# Patient Record
Sex: Female | Born: 1980 | Race: Black or African American | Hispanic: No | Marital: Single | Smoking: Former smoker
Health system: Southern US, Community
[De-identification: ages and names within clinical notes are randomized; demographics above are authoritative.]

## PROBLEM LIST (undated history)

## (undated) DIAGNOSIS — M545 Low back pain, unspecified: Secondary | ICD-10-CM

## (undated) DIAGNOSIS — I10 Essential (primary) hypertension: Secondary | ICD-10-CM

## (undated) DIAGNOSIS — L02416 Cutaneous abscess of left lower limb: Secondary | ICD-10-CM

## (undated) DIAGNOSIS — T8859XA Other complications of anesthesia, initial encounter: Secondary | ICD-10-CM

## (undated) DIAGNOSIS — J45909 Unspecified asthma, uncomplicated: Secondary | ICD-10-CM

## (undated) DIAGNOSIS — K8020 Calculus of gallbladder without cholecystitis without obstruction: Secondary | ICD-10-CM

## (undated) DIAGNOSIS — K219 Gastro-esophageal reflux disease without esophagitis: Secondary | ICD-10-CM

## (undated) DIAGNOSIS — N611 Abscess of the breast and nipple: Secondary | ICD-10-CM

## (undated) DIAGNOSIS — G4700 Insomnia, unspecified: Secondary | ICD-10-CM

## (undated) DIAGNOSIS — T4145XA Adverse effect of unspecified anesthetic, initial encounter: Secondary | ICD-10-CM

## (undated) HISTORY — DX: Low back pain: M54.5

## (undated) HISTORY — DX: Insomnia, unspecified: G47.00

## (undated) HISTORY — DX: Cutaneous abscess of left lower limb: L02.416

## (undated) HISTORY — DX: Low back pain, unspecified: M54.50

## (undated) HISTORY — DX: Gastro-esophageal reflux disease without esophagitis: K21.9

## (undated) HISTORY — DX: Abscess of the breast and nipple: N61.1

## (undated) HISTORY — DX: Calculus of gallbladder without cholecystitis without obstruction: K80.20

---

## 2007-04-28 HISTORY — PX: OVARIAN CYST REMOVAL: SHX89

## 2009-10-31 ENCOUNTER — Ambulatory Visit: Payer: Self-pay | Admitting: Pain Medicine

## 2009-11-06 ENCOUNTER — Ambulatory Visit: Payer: Self-pay | Admitting: Pain Medicine

## 2009-11-10 ENCOUNTER — Ambulatory Visit: Payer: Self-pay | Admitting: Pain Medicine

## 2010-10-30 DIAGNOSIS — M545 Low back pain: Secondary | ICD-10-CM | POA: Insufficient documentation

## 2010-10-30 DIAGNOSIS — R52 Pain, unspecified: Secondary | ICD-10-CM | POA: Insufficient documentation

## 2010-10-30 DIAGNOSIS — IMO0002 Reserved for concepts with insufficient information to code with codable children: Secondary | ICD-10-CM | POA: Insufficient documentation

## 2011-01-16 DIAGNOSIS — G4489 Other headache syndrome: Secondary | ICD-10-CM | POA: Insufficient documentation

## 2011-01-18 DIAGNOSIS — S338XXA Sprain of other parts of lumbar spine and pelvis, initial encounter: Secondary | ICD-10-CM | POA: Insufficient documentation

## 2012-12-15 DIAGNOSIS — Z3042 Encounter for surveillance of injectable contraceptive: Secondary | ICD-10-CM | POA: Insufficient documentation

## 2014-12-21 DIAGNOSIS — L02416 Cutaneous abscess of left lower limb: Secondary | ICD-10-CM | POA: Insufficient documentation

## 2014-12-21 DIAGNOSIS — N611 Abscess of the breast and nipple: Secondary | ICD-10-CM | POA: Insufficient documentation

## 2015-08-06 DIAGNOSIS — F5104 Psychophysiologic insomnia: Secondary | ICD-10-CM | POA: Insufficient documentation

## 2015-08-06 DIAGNOSIS — K219 Gastro-esophageal reflux disease without esophagitis: Secondary | ICD-10-CM | POA: Insufficient documentation

## 2015-08-06 DIAGNOSIS — I10 Essential (primary) hypertension: Secondary | ICD-10-CM | POA: Insufficient documentation

## 2015-08-06 DIAGNOSIS — F1120 Opioid dependence, uncomplicated: Secondary | ICD-10-CM | POA: Insufficient documentation

## 2016-03-17 ENCOUNTER — Other Ambulatory Visit: Payer: Self-pay | Admitting: Family Medicine

## 2016-03-17 DIAGNOSIS — R319 Hematuria, unspecified: Secondary | ICD-10-CM

## 2016-03-31 ENCOUNTER — Ambulatory Visit
Admission: RE | Admit: 2016-03-31 | Discharge: 2016-03-31 | Disposition: A | Discharge status: Home / Self Care | Payer: Medicaid Other | Source: Ambulatory Visit | Attending: Family Medicine | Admitting: Family Medicine

## 2016-03-31 ENCOUNTER — Encounter: Payer: Self-pay | Admitting: Radiology

## 2016-03-31 DIAGNOSIS — R319 Hematuria, unspecified: Secondary | ICD-10-CM | POA: Diagnosis not present

## 2016-03-31 HISTORY — DX: Essential (primary) hypertension: I10

## 2016-03-31 HISTORY — DX: Unspecified asthma, uncomplicated: J45.909

## 2016-03-31 MED ORDER — IOPAMIDOL (ISOVUE-300) INJECTION 61%
150.0000 mL | Freq: Once | INTRAVENOUS | Status: AC | PRN
  Administered 2016-03-31: 150 mL via INTRAVENOUS

## 2016-05-12 ENCOUNTER — Other Ambulatory Visit: Payer: Self-pay | Admitting: Family Medicine

## 2016-05-12 DIAGNOSIS — R1011 Right upper quadrant pain: Secondary | ICD-10-CM

## 2016-05-13 ENCOUNTER — Ambulatory Visit
Admission: RE | Admit: 2016-05-13 | Discharge: 2016-05-13 | Disposition: A | Discharge status: Home / Self Care | Payer: Medicaid Other | Source: Ambulatory Visit | Attending: Family Medicine | Admitting: Family Medicine

## 2016-05-13 DIAGNOSIS — R1011 Right upper quadrant pain: Secondary | ICD-10-CM | POA: Diagnosis present

## 2016-05-13 DIAGNOSIS — K8020 Calculus of gallbladder without cholecystitis without obstruction: Secondary | ICD-10-CM | POA: Diagnosis not present

## 2016-05-22 ENCOUNTER — Other Ambulatory Visit: Payer: Self-pay

## 2016-05-26 ENCOUNTER — Ambulatory Visit (INDEPENDENT_AMBULATORY_CARE_PROVIDER_SITE_OTHER): Payer: Medicaid Other | Admitting: Surgery

## 2016-05-26 ENCOUNTER — Encounter: Payer: Self-pay | Admitting: Surgery

## 2016-05-26 VITALS — BP 159/99 | HR 76 | Temp 98.9°F | Ht 70.0 in | Wt 255.0 lb

## 2016-05-26 DIAGNOSIS — R1011 Right upper quadrant pain: Secondary | ICD-10-CM | POA: Diagnosis not present

## 2016-05-26 DIAGNOSIS — G8929 Other chronic pain: Secondary | ICD-10-CM

## 2016-05-26 NOTE — Patient Instructions (Addendum)
We will have you see Dr. Tobi BastosAnna (GI Physician ) next week for his opinion about Abdominal Pain. After you have seen him, we will see you back in the office to discuss surgery if the Gastroenterologist does not see any problems on exam. Please see both appointments below.  Low-Fat Diet for Pancreatitis or Gallbladder Conditions A low-fat diet can be helpful if you have pancreatitis or a gallbladder condition. With these conditions, your pancreas and gallbladder have trouble digesting fats. A healthy eating plan with less fat will help rest your pancreas and gallbladder and reduce your symptoms. What do I need to know about this diet?  Eat a low-fat diet.  Reduce your fat intake to less than 20-30% of your total daily calories. This is less than 50-60 g of fat per day.  Remember that you need some fat in your diet. Ask your dietician what your daily goal should be.  Choose nonfat and low-fat healthy foods. Look for the words "nonfat," "low fat," or "fat free."  As a guide, look on the label and choose foods with less than 3 g of fat per serving. Eat only one serving.  Avoid alcohol.  Do not smoke. If you need help quitting, talk with your health care provider.  Eat small frequent meals instead of three large heavy meals. What foods can I eat? Grains  Include healthy grains and starches such as potatoes, wheat bread, fiber-rich cereal, and brown rice. Choose whole grain options whenever possible. In adults, whole grains should account for 45-65% of your daily calories. Fruits and Vegetables  Eat plenty of fruits and vegetables. Fresh fruits and vegetables add fiber to your diet. Meats and Other Protein Sources  Eat lean meat such as chicken and pork. Trim any fat off of meat before cooking it. Eggs, fish, and beans are other sources of protein. In adults, these foods should account for 10-35% of your daily calories. Dairy  Choose low-fat milk and dairy options. Dairy includes fat and protein,  as well as calcium. Fats and Oils  Limit high-fat foods such as fried foods, sweets, baked goods, sugary drinks. Other  Creamy sauces and condiments, such as mayonnaise, can add extra fat. Think about whether or not you need to use them, or use smaller amounts or low fat options. What foods are not recommended?  High fat foods, such as:  [employer]aked goods.  Ice cream.  [place] toast.  Sweet rolls.  Pizza.  Cheese bread.  Foods covered with batter, butter, creamy sauces, or cheese.  Fried foods.  Sugary drinks and desserts.  Foods that cause gas or bloating This information is not intended to replace advice given to you by your health care provider. Make sure you discuss any questions you have with your health care provider. Document Released: 04/18/2013 Document Revised: 09/19/2015 Document Reviewed: 03/27/2013 Elsevier Interactive Patient Education  2017 [employer]
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
## 2016-06-03 ENCOUNTER — Ambulatory Visit: Payer: Self-pay | Admitting: Gastroenterology

## 2016-06-04 ENCOUNTER — Inpatient Hospital Stay: Admit: 2016-06-04 | Payer: Self-pay

## 2016-06-04 ENCOUNTER — Ambulatory Visit (INDEPENDENT_AMBULATORY_CARE_PROVIDER_SITE_OTHER): Payer: Medicaid Other | Admitting: Gastroenterology

## 2016-06-04 ENCOUNTER — Encounter: Payer: Self-pay | Admitting: Gastroenterology

## 2016-06-04 VITALS — BP 140/90 | HR 63 | Ht 70.0 in | Wt 258.0 lb

## 2016-06-04 DIAGNOSIS — R194 Change in bowel habit: Secondary | ICD-10-CM

## 2016-06-04 DIAGNOSIS — R1011 Right upper quadrant pain: Secondary | ICD-10-CM
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
## 2016-06-08 ENCOUNTER — Other Ambulatory Visit: Payer: Self-pay

## 2016-06-09 ENCOUNTER — Ambulatory Visit
Admission: RE | Admit: 2016-06-09 | Discharge: 2016-06-09 | Disposition: A | Discharge status: Home / Self Care | Payer: Medicaid Other | Source: Ambulatory Visit | Attending: Gastroenterology | Admitting: Gastroenterology

## 2016-06-09 ENCOUNTER — Ambulatory Visit: Payer: Medicaid Other | Admitting: Certified Registered Nurse Anesthetist

## 2016-06-09 ENCOUNTER — Encounter
Admission: RE | Disposition: A | Discharge status: Home / Self Care | Payer: Self-pay | Source: Ambulatory Visit | Attending: Gastroenterology

## 2016-06-09 ENCOUNTER — Other Ambulatory Visit
Admission: RE | Admit: 2016-06-09 | Discharge: 2016-06-09 | Disposition: A | Discharge status: Home / Self Care | Payer: Medicaid Other | Source: Ambulatory Visit | Attending: Gastroenterology | Admitting: Gastroenterology

## 2016-06-09 ENCOUNTER — Encounter: Payer: Self-pay | Admitting: Certified Registered Nurse Anesthetist

## 2016-06-09 DIAGNOSIS — I10 Essential (primary) hypertension: Secondary | ICD-10-CM | POA: Diagnosis not present

## 2016-06-09 DIAGNOSIS — Z79891 Long term (current) use of opiate analgesic: Secondary | ICD-10-CM | POA: Diagnosis not present

## 2016-06-09 DIAGNOSIS — R1013 Epigastric pain: Secondary | ICD-10-CM

## 2016-06-09 DIAGNOSIS — Z79899 Other long term (current) drug therapy: Secondary | ICD-10-CM | POA: Insufficient documentation

## 2016-06-09 DIAGNOSIS — R194 Change in bowel habit: Secondary | ICD-10-CM | POA: Insufficient documentation

## 2016-06-09 DIAGNOSIS — K2950 Unspecified chronic gastritis without bleeding: Secondary | ICD-10-CM | POA: Diagnosis not present

## 2016-06-09 DIAGNOSIS — K2970 Gastritis, unspecified, without bleeding: Secondary | ICD-10-CM

## 2016-06-09 DIAGNOSIS — R1011 Right upper quadrant pain: Secondary | ICD-10-CM | POA: Diagnosis not present

## 2016-06-09 DIAGNOSIS — F17210 Nicotine dependence, cigarettes, uncomplicated: Secondary | ICD-10-CM | POA: Insufficient documentation

## 2016-06-09 DIAGNOSIS — K219 Gastro-esophageal reflux disease without esophagitis: Secondary | ICD-10-CM | POA: Diagnosis not present

## 2016-06-09 DIAGNOSIS — J45909 Unspecified asthma, uncomplicated: Secondary | ICD-10-CM | POA: Diagnosis not present

## 2016-06-09 DIAGNOSIS — Z793 Long term (current) use of hormonal contraceptives: Secondary | ICD-10-CM | POA: Insufficient documentation

## 2016-06-09 HISTORY — PX: ESOPHAGOGASTRODUODENOSCOPY (EGD) WITH PROPOFOL: SHX5813

## 2016-06-09 HISTORY — PX: COLONOSCOPY WITH PROPOFOL: SHX5780

## 2016-06-09 LAB — POCT PREGNANCY, URINE: PREG TEST UR: NEGATIVE

## 2016-06-09 SURGERY — COLONOSCOPY WITH PROPOFOL
Anesthesia: General

## 2016-06-09 MED ORDER — MIDAZOLAM HCL 2 MG/2ML IJ SOLN
INTRAMUSCULAR | Status: AC
  Filled 2016-06-09: qty 2

## 2016-06-09 MED ORDER — PROPOFOL 500 MG/50ML IV EMUL
INTRAVENOUS | Status: DC | PRN
  Administered 2016-06-09: 140 ug/kg/min via INTRAVENOUS

## 2016-06-09 MED ORDER — SODIUM CHLORIDE 0.9 % IV SOLN
INTRAVENOUS | Status: DC
  Administered 2016-06-09: 11:00:00 via INTRAVENOUS

## 2016-06-09 MED ORDER — ACETAMINOPHEN 500 MG PO TABS
ORAL_TABLET | ORAL | Status: AC
  Administered 2016-06-09: 12:00:00
  Filled 2016-06-09: qty 2

## 2016-06-09 MED ORDER — LIDOCAINE HCL (PF) 2 % IJ SOLN
INTRAMUSCULAR | Status: AC
  Filled 2016-06-09: qty 2

## 2016-06-09 MED ORDER — PROPOFOL 500 MG/50ML IV EMUL
INTRAVENOUS | Status: AC
  Filled 2016-06-09: qty 50

## 2016-06-09 MED ORDER — PROPOFOL 10 MG/ML IV BOLUS
INTRAVENOUS | Status: AC
  Filled 2016-06-09: qty 20

## 2016-06-09 MED ORDER — PROPOFOL 10 MG/ML IV BOLUS
INTRAVENOUS | Status: DC | PRN
Start: 2016-06-09 — End: 2016-06-09
  Administered 2016-06-09: 40 mg via INTRAVENOUS
  Administered 2016-06-09: 20 mg via INTRAVENOUS

## 2016-06-09 MED ORDER — LIDOCAINE HCL (CARDIAC) 20 MG/ML IV SOLN
INTRAVENOUS | Status: DC | PRN
  Administered 2016-06-09: 50 mg via INTRAVENOUS

## 2016-06-09 MED ORDER — MIDAZOLAM HCL 2 MG/2ML IJ SOLN
INTRAMUSCULAR | Status: DC | PRN
  Administered 2016-06-09: 2 mg via INTRAVENOUS

## 2016-06-09 NOTE — Anesthesia Post-op Follow-up Note (Cosign Needed)
Anesthesia QCDR form completed.        
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
## 2016-06-09 NOTE — Anesthesia Postprocedure Evaluation (Signed)
Anesthesia Post Note  Patient: Candace LocksRhonda L Morrow  Procedure(s) Performed: Procedure(s) (LRB): COLONOSCOPY WITH PROPOFOL (N/A) ESOPHAGOGASTRODUODENOSCOPY (EGD) WITH PROPOFOL (N/A)  Patient location during evaluation: Endoscopy Anesthesia Type: General Level of consciousness: awake and alert and oriented Pain management: pain level controlled Vital Signs Assessment: post-procedure vital signs reviewed and stable Respiratory status: spontaneous breathing, nonlabored ventilation and respiratory function stable Cardiovascular status: blood pressure returned to baseline and stable Postop Assessment: no signs of nausea or vomiting Anesthetic complications: no     Last Vitals:  Vitals:   06/09/16 1215 06/09/16 1225  BP: 127/73 126/73  Pulse: 70 64  Resp: 20 14  Temp:      Last Pain:  Vitals:   06/09/16 1215  TempSrc:   PainSc: 8                  Taisley Mordan

## 2016-06-09 NOTE — Anesthesia Procedure Notes (Signed)
Date/Time: 06/09/2016 11:05 AM Performed by: Ginger CarneMICHELET, Hanan Moen Pre-anesthesia Checklist: Patient identified, Emergency Drugs available, Patient being monitored, Timeout performed and Suction available Patient Re-evaluated:Patient Re-evaluated prior to inductionOxygen Delivery Method: Nasal cannula

## 2016-06-09 NOTE — Op Note (Signed)
[facility] Gastroenterology Patient Name: Candace Morrow Procedure Date: 06/09/2016 10:52 AM MRN: [id number] Account #: [id number] Date of Birth: 1980-07-08 Admit Type: Outpatient Age: 36 Room: [facility] ENDO ROOM 1 Gender: Female Note Status: Finalized Procedure:            Colonoscopy Indications:          Change in bowel habits, Incidental change in bowel                        habits noted Providers:            Wyline Mood MD, MD Referring MD:         Courtney Paris. Cliffton Asters, MD (Referring MD) Medicines:            Monitored Anesthesia Care Complications:        No immediate complications. Procedure:            Pre-Anesthesia Assessment:                       - Prior to the procedure, a History and Physical was                        performed, and patient medications, allergies and                        sensitivities were reviewed. The patient's tolerance of                        previous anesthesia was reviewed.                       - The risks and benefits of the procedure and the                        sedation options and risks were discussed with the                        patient. All questions were answered and informed                        consent was obtained.                       - The risks and benefits of the procedure and the                        sedation options and risks were discussed with the                        patient. All questions were answered and informed                        consent was obtained.                       - ASA Grade Assessment: III - A patient with severe                        systemic disease.  After obtaining informed consent, the colonoscope was                        passed under direct vision. Throughout the procedure,                        the patient's blood pressure, pulse, and oxygen                        saturations were monitored continuously. The Olympus   CF-H180AL colonoscope ( S#: [id number] ) was introduced                        through the anus and advanced to the the cecum,                        identified by the appendiceal orifice, IC valve and                        transillumination. The colonoscopy was performed with                        ease. The patient tolerated the procedure well. The                        quality of the bowel preparation was good. Findings:      The perianal and digital rectal examinations were normal.      The entire examined colon appeared normal on direct and retroflexion       views.      The colon (entire examined portion) appeared normal. Biopsies for       histology were taken with a cold forceps for evaluation of microscopic       colitis. Impression:           - The entire examined colon is normal on direct and                        retroflexion views.                       - The entire examined colon is normal. Biopsied. Recommendation:       - Discharge patient to home (with escort).                       - Resume previous diet.                       - Continue present medications.                       - Await pathology results.                       - Repeat colonoscopy at age 36 for screening purposes.                       - Return to GI office as previously scheduled. Procedure Code(s):    --- Professional ---                       [phone], Colonoscopy, flexible; with biopsy, single or  multiple Diagnosis Code(s):    --- Professional ---                       R19.4, Change in bowel habit CPT copyright 2016 American Medical Association. All rights reserved. The codes documented in this report are preliminary and upon coder review may  be revised to meet current compliance requirements. Wyline Mood, MD Wyline Mood MD, MD 06/09/2016 11:39:28 AM This report has been signed electronically. Number of Addenda: 0 Note Initiated On: 06/09/2016 10:52 AM Scope Withdrawal Time:  0 hours 13 minutes 20 seconds  Total Procedure Duration: 0 hours 18 minutes 20 seconds       [facility]

## 2016-06-09 NOTE — Telephone Encounter (Signed)
error 

## 2016-06-09 NOTE — Op Note (Signed)
[facility] Gastroenterology Patient Name: Candace BaneRhonda Morrow Procedure Date: 06/09/2016 11:03 AM MRN: [id number] Account #: [id number] Date of Birth: [birth year] Admit Type: Outpatient Age: 36 Room: [facility] ENDO ROOM 1 Gender: Female Note Status: Finalized Procedure:            Upper GI endoscopy Indications:          Dyspepsia Providers:            Wyline MoodKiran Tilmon Wisehart MD, MD Referring MD:         Courtney ParisElizabeth B. Cliffton AstersWhite, MD (Referring MD) Medicines:            Monitored Anesthesia Care Complications:        No immediate complications. Procedure:            Pre-Anesthesia Assessment:                       - ASA Grade Assessment: III - A patient with severe                        systemic disease.                       - Prior to the procedure, a History and Physical was                        performed, and patient medications, allergies and                        sensitivities were reviewed. The patient's tolerance of                        previous anesthesia was reviewed.                       - The risks and benefits of the procedure and the                        sedation options and risks were discussed with the                        patient. All questions were answered and informed                        consent was obtained.                       - The risks and benefits of the procedure and the                        sedation options and risks were discussed with the                        patient. All questions were answered and informed                        consent was obtained.                       After obtaining informed consent, the endoscope was  passed under direct vision. Throughout the procedure,                        the patient's blood pressure, pulse, and oxygen                        saturations were monitored continuously. The                        Colonoscope was introduced through the mouth, and                        advanced to  the third part of duodenum. The upper GI                        endoscopy was somewhat difficult due to a lot of                        coughing {skip}. [Solution]. Findings:      The esophagus was normal.      The examined duodenum was normal.      Localized moderate inflammation characterized by congestion (edema) and       erythema was found in the gastric antrum. Biopsies were taken with a       cold forceps for histology. Impression:           - Normal esophagus.                       - Normal examined duodenum.                       - Gastritis. Biopsied. Recommendation:       - Discharge patient to home (with escort).                       - Resume previous diet.                       - Continue present medications.                       - Await pathology results.                       - Return to my office as previously scheduled. Procedure Code(s):    --- Professional ---                       [phone], Esophagogastroduodenoscopy, flexible, transoral;                        with biopsy, single or multiple Diagnosis Code(s):    --- Professional ---                       K29.70, Gastritis, unspecified, without bleeding                       R10.13, Epigastric pain CPT copyright 2016 American Medical Association. All rights reserved. The codes documented in this report are preliminary and upon coder review may  be revised to meet current compliance requirements. Wyline Mood, MD Wyline Mood MD, MD 06/09/2016 11:15:53 AM This report has  been signed electronically. Number of Addenda: 0 Note Initiated On: 06/09/2016 11:03 AM      [facility]

## 2016-06-09 NOTE — Anesthesia Preprocedure Evaluation (Addendum)
Anesthesia Evaluation  Patient identified by MRN, date of birth, ID band Patient awake    Reviewed: Allergy & Precautions, NPO status , Patient's Chart, lab work & pertinent test results, reviewed documented beta blocker date and time   Airway Mallampati: II  TM Distance: >3 FB     Dental no notable dental hx.    Pulmonary asthma , Current Smoker,    Pulmonary exam normal        Cardiovascular hypertension, Pt. on medications and Pt. on home beta blockers Normal cardiovascular exam     Neuro/Psych  Headaches,    GI/Hepatic Neg liver ROS, GERD  Medicated and Controlled,  Endo/Other  negative endocrine ROS  Renal/GU negative Renal ROS  negative genitourinary   Musculoskeletal negative musculoskeletal ROS (+)   Abdominal Normal abdominal exam  (+)   Peds negative pediatric ROS (+)  Hematology negative hematology ROS (+)   Anesthesia Other Findings   Reproductive/Obstetrics                             Anesthesia Physical Anesthesia Plan  ASA: II  Anesthesia Plan: General   Post-op Pain Management:    Induction: Intravenous  Airway Management Planned: Nasal Cannula  Additional Equipment:   Intra-op Plan:   Post-operative Plan: Extubation in OR  Informed Consent: I have reviewed the patients History and Physical, chart, labs and discussed the procedure including the risks, benefits and alternatives for the proposed anesthesia with the patient or authorized representative who has indicated his/her understanding and acceptance.   Dental advisory given  Plan Discussed with: CRNA and Surgeon  Anesthesia Plan Comments:         Anesthesia Quick Evaluation

## 2016-06-09 NOTE — Transfer of Care (Signed)
Immediate Anesthesia Transfer of Care Note  Patient: Kallie LocksRhonda L Ciani  Procedure(s) Performed: Procedure(s): COLONOSCOPY WITH PROPOFOL (N/A) ESOPHAGOGASTRODUODENOSCOPY (EGD) WITH PROPOFOL (N/A)  Patient Location: PACU  Anesthesia Type:General  Level of Consciousness: awake  Airway & Oxygen Therapy: Patient Spontanous Breathing  Post-op Assessment: Report given to RN and Post -op Vital signs reviewed and stable  Post vital signs: Reviewed and stable  Last Vitals:  Vitals:   06/09/16 1041  BP: 139/78  Pulse: 68  Resp: 18  Temp: (!) 35.9 C    Last Pain:  Vitals:   06/09/16 1041  TempSrc: Tympanic         Complications: No apparent anesthesia complications

## 2016-06-10 ENCOUNTER — Encounter: Payer: Self-pay | Admitting: Gastroenterology

## 2016-06-10 LAB — SURGICAL PATHOLOGY

## 2016-06-11 ENCOUNTER — Telehealth: Payer: Self-pay

## 2016-06-11 LAB — H. PYLORI ANTIGEN, STOOL: H. PYLORI STOOL AG, EIA: NEGATIVE

## 2016-06-11 NOTE — Telephone Encounter (Signed)
Patient called returning the  phone call. I read her the note below and she is not happy. She doesn't understand why she needs to come back to the office when she already received her results from Dr. Tobi BastosAnna. She just wants her surgery scheduled without having to come into the office. I explained to her that it is protocol for the patient to be seen prior to scheduling a surgery but that I will let the nurse know. Please call patient and advice.

## 2016-06-11 NOTE — Telephone Encounter (Signed)
Pt notified of h pylori lab test.

## 2016-06-11 NOTE — Telephone Encounter (Signed)
-----   Message from Wyline MoodKiran Anna, MD sent at 06/11/2016 11:23 AM EST ----- H pylori is negative

## 2016-06-11 NOTE — Telephone Encounter (Signed)
Returned phone call to patient at this time. No answer. Left voicemail for return phone call. 

## 2016-06-11 NOTE — Telephone Encounter (Signed)
Spoke with Dr. Everlene FarrierPabon about results of EGD/Colonoscopy, H.Pylori testing, and GI consultation. He would like to see patient back in office to discuss findings and potential for Cholecystectomy.  Appointment made for patient for 06/18/16.  Call made to patient at this time. No answer. Left voicemail for return phone call.

## 2016-06-12 ENCOUNTER — Telehealth: Payer: Self-pay

## 2016-06-12 NOTE — Telephone Encounter (Signed)
Patient called in at this time to speak with me in regards to this appointment. She is extremely angry that she is having to come back in to discuss the risks, benefits, and details of the surgery. She was very angry screaming at me on the phone. She yelled into the phone, "The next time I need surgery, I will take my butt down to [facility] because all of this has been ridiculous."  She was offered a referral to [facility] at this time and patient declined this. She was told if she changes her mind and would like a referral at any time she can let us know and this can be sent.  The patient wants a surgery date today. I explained to her that she will be given her surgery date and information at her appointment on 06/18/16. She was not happy with this as well.  She was read the appointment details that she has been given.

## 2016-06-12 NOTE — Telephone Encounter (Signed)
Call made once again to patient. No answer. Left voicemail for return phone call.  I will be glad to speak with patient if she returns phone call.

## 2016-06-16 ENCOUNTER — Telehealth: Payer: Self-pay

## 2016-06-16 ENCOUNTER — Other Ambulatory Visit: Payer: Self-pay

## 2016-06-16 NOTE — Telephone Encounter (Signed)
-----   Message from Rayann HemanGinger Feldpausch, [place] sent at 06/16/2016 11:21 AM EST -----   ----- Message ----- From: Wyline MoodKiran Anna, MD Sent: 06/16/2016  10:48 AM To: Rayann HemanGinger Feldpausch, CMA  Biopsies show only gastritis. Recall colonoscopy at age 36. F/u with Dr Everlene FarrierPabon

## 2016-06-16 NOTE — Telephone Encounter (Signed)
Encounter opened in error

## 2016-06-16 NOTE — Telephone Encounter (Signed)
Spoke with patient and advised of gastritis and follow-up with Dr. Everlene FarrierPabon. Colonoscopy due after age 36.

## 2016-06-18 ENCOUNTER — Encounter: Payer: Self-pay | Admitting: Surgery

## 2016-06-18 ENCOUNTER — Ambulatory Visit (INDEPENDENT_AMBULATORY_CARE_PROVIDER_SITE_OTHER): Payer: Medicaid Other | Admitting: Surgery

## 2016-06-18 VITALS — BP 151/90 | HR 79 | Temp 98.5°F | Ht 65.0 in | Wt 259.0 lb

## 2016-06-18 DIAGNOSIS — K8020 Calculus of gallbladder without cholecystitis without obstruction: Secondary | ICD-10-CM
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
## 2016-06-18 NOTE — Patient Instructions (Signed)
You have requested to have your gallbladder removed. We will arrange for this to be done on 06/29/16 at [facility] with Dr. Everlene FarrierPabon.  You will most likely be out of work 1-2 weeks for this surgery. You will return after your post-op appointment with a lifting restriction for approximately 4 more weeks.  You will be able to eat anything you would like to following surgery. But, start by eating a bland diet and advance this as tolerated.  Please see the (blue)pre-care form that you have been given today. If you have any questions, please call our office.  Laparoscopic Cholecystectomy Laparoscopic cholecystectomy is surgery to remove the gallbladder. The gallbladder is located in the upper right part of the abdomen, behind the liver. It is a storage sac for bile, which is produced in the liver. Bile aids in the digestion and absorption of fats. Cholecystectomy is often done for inflammation of the gallbladder (cholecystitis). This condition is usually caused by a buildup of gallstones (cholelithiasis) in the gallbladder. Gallstones can block the flow of bile, and that can result in inflammation and pain. In severe cases, emergency surgery may be required. If emergency surgery is not required, you will have time to prepare for the procedure. Laparoscopic surgery is an alternative to open surgery. Laparoscopic surgery has a shorter recovery time. Your common bile duct may also need to be examined during the procedure. If stones are found in the common bile duct, they may be removed. LET [facility] KNOW ABOUT:  Any allergies you have.  All medicines you are taking, including vitamins, herbs, eye drops, creams, and over-the-counter medicines.  Previous problems you or members of your family have had with the use of anesthetics.  Any blood disorders you have.  Previous surgeries you have had.    Any medical conditions you have. RISKS AND COMPLICATIONS Generally, this is a safe  procedure. However, problems may occur, including:  Infection.  Bleeding.  Allergic reactions to medicines.  Damage to other structures or organs.  A stone remaining in the common bile duct.  A bile leak from the cyst duct that is clipped when your gallbladder is removed.  The need to convert to open surgery, which requires a larger incision in the abdomen. This may be necessary if your surgeon thinks that it is not safe to continue with a laparoscopic procedure. BEFORE THE PROCEDURE  Ask your health care provider about:  Changing or stopping your regular medicines. This is especially important if you are taking diabetes medicines or blood thinners.  Taking medicines such as aspirin and ibuprofen. These medicines can thin your blood. Do not take these medicines before your procedure if your health care provider instructs you not to.  Follow instructions from your health care provider about eating or drinking restrictions.  Let your health care provider know if you develop a cold or an infection before surgery.  Plan to have someone take you home after the procedure.  Ask your health care provider how your surgical site will be marked or identified.  You may be given antibiotic medicine to help prevent infection. PROCEDURE  To reduce your risk of infection:  Your health care team will wash or sanitize their hands.  Your skin will be washed with soap.  An IV tube may be inserted into one of your veins.  You will be given a medicine to make you fall asleep (general anesthetic).  A breathing tube will be placed in your mouth.  The surgeon  will make several small cuts (incisions) in your abdomen.  A thin, lighted tube (laparoscope) that has a tiny camera on the end will be inserted through one of the small incisions. The camera on the laparoscope will send a picture to a TV screen (monitor) in the operating room. This will give the surgeon a good view inside your  abdomen.  A gas will be pumped into your abdomen. This will expand your abdomen to give the surgeon more room to perform the surgery.  Other tools that are needed for the procedure will be inserted through the other incisions. The gallbladder will be removed through one of the incisions.  After your gallbladder has been removed, the incisions will be closed with stitches (sutures), staples, or skin glue.  Your incisions may be covered with a bandage (dressing). The procedure may vary among health care providers and hospitals. AFTER THE PROCEDURE  Your blood pressure, heart rate, breathing rate, and blood oxygen level will be monitored often until the medicines you were given have worn off.  You will be given medicines as needed to control your pain.   This information is not intended to replace advice given to you by your health care provider. Make sure you discuss any questions you have with your health care provider.   Document Released: 04/13/2005 Document Revised: 01/02/2015 Document Reviewed: 11/23/2012 Elsevier Interactive Patient Education 2016 [place] Diet for Pancreatitis or Gallbladder Conditions A low-fat diet can be helpful if you have pancreatitis or a gallbladder condition. With these conditions, your pancreas and gallbladder have trouble digesting fats. A healthy eating plan with less fat will help rest your pancreas and gallbladder and reduce your symptoms. WHAT DO I NEED TO KNOW ABOUT THIS DIET?  Eat a low-fat diet.  Reduce your fat intake to less than 20-30% of your total daily calories. This is less than 50-60 g of fat per day.  Remember that you need some fat in your diet. Ask your dietician what your daily goal should be.  Choose nonfat and low-fat healthy foods. Look for the words "nonfat," "low fat," or "fat free."  As a guide, look on the label and choose foods with less than 3 g of fat per serving. Eat only one serving.  Avoid  alcohol.  Do not smoke. If you need help quitting, talk with your health care provider.  Eat small frequent meals instead of three large heavy meals. WHAT FOODS CAN I EAT? Grains Include healthy grains and starches such as potatoes, wheat bread, fiber-rich cereal, and brown rice. Choose whole grain options whenever possible. In adults, whole grains should account for 45-65% of your daily calories.  Fruits and Vegetables Eat plenty of fruits and vegetables. Fresh fruits and vegetables add fiber to your diet. Meats and Other Protein Sources Eat lean meat such as chicken and pork. Trim any fat off of meat before cooking it. Eggs, fish, and beans are other sources of protein. In adults, these foods should account for 10-35% of your daily calories. Dairy Choose low-fat milk and dairy options. Dairy includes fat and protein, as well as calcium.  Fats and Oils Limit high-fat foods such as fried foods, sweets, baked goods, sugary drinks.  Other Creamy sauces and condiments, such as mayonnaise, can add extra fat. Think about whether or not you need to use them, or use smaller amounts or low fat options. WHAT FOODS ARE NOT RECOMMENDED?  High fat foods, such as:  [employer].  Ice  cream.  French toast.  Sweet rolls.  Pizza.  Cheese bread.  Foods covered with batter, butter, creamy sauces, or cheese.  Fried foods.  Sugary drinks and desserts.  Foods that cause gas or bloating   This information is not intended to replace advice given to you by your health care provider. Make sure you discuss any questions you have with your health care provider.   Document Released: 04/18/2013 Document Reviewed: 04/18/2013 Elsevier Interactive Patient Education [employer].

## 2016-06-19 ENCOUNTER — Telehealth: Payer: Self-pay

## 2016-06-19 NOTE — Telephone Encounter (Signed)
No authorization required for CPT code [id number]. Patient has Medicaid.

## 2016-06-19 NOTE — Telephone Encounter (Signed)
Patient requested work note for future surgical plans to be faxed to her employer ([employer]- [place]). Call made to the place of work at this time. They state that their is not a fax number at that location and that the patient will need to bring the work note in.

## 2016-06-19 NOTE — Telephone Encounter (Signed)
Called patient to advise of Surgery Date as well as Pre-Admission appointment date, time, and location. No answer. Left voicemail for return phone call.  Surgery information is below:  Surgery Date: 06/29/16  Pre-admit Appointment: 06/22/16 from 1p-5p (Phone)  Patient has been advised to call [phone] the day before surgery between 1-3pm to obtain arrival time.

## 2016-06-19 NOTE — Telephone Encounter (Signed)
Returned phone call to patient at this time. I reviewed her Surgery and Pre-admission information. I also explained that I have contacted her employer and their is no fax available. She ask that I send her work note via [employer] visit[employer]. A note has been printed and was sent out in the mail to her manager per patient's request.

## 2016-06-22 ENCOUNTER — Encounter
Admission: RE | Admit: 2016-06-22 | Discharge: 2016-06-22 | Disposition: A | Discharge status: Home / Self Care | Payer: Medicaid Other | Source: Ambulatory Visit | Attending: Surgery | Admitting: Surgery

## 2016-06-22 ENCOUNTER — Encounter: Payer: Self-pay | Admitting: *Deleted

## 2016-06-22 NOTE — Patient Instructions (Signed)
  Your procedure is scheduled on: 06-29-16 Report to Same Day Surgery 2nd floor medical mall [facility](Medical Mall Entrance-take elevator on left to 2nd floor.  Check in with surgery information desk.) To find out your arrival time please call [phone] between 1PM - 3PM on 06-26-16  Remember: Instructions that are not followed completely may result in serious medical risk, up to and including death, or upon the discretion of your surgeon and anesthesiologist your surgery may need to be rescheduled.    _x___ 1. Do not eat food or drink liquids after midnight. No gum chewing or hard candies.     __x__ 2. No Alcohol for 24 hours before or after surgery.   __x__3. No Smoking for 24 prior to surgery.   ____  4. Bring all medications with you on the day of surgery if instructed.    __x__ 5. Notify your doctor if there is any change in your medical condition     (cold, fever, infections).     Do not wear jewelry, make-up, hairpins, clips or nail polish.  Do not wear lotions, powders, or perfumes. You may wear deodorant.  Do not shave 48 hours prior to surgery. Men may shave face and neck.  Do not bring valuables to the hospital.    [facility] not responsible for any belongings or valuables.               Contacts, dentures or bridgework may not be worn into surgery.  Leave your suitcase in the car. After surgery it may be brought to your room.  For patients admitted to the hospital, discharge time is determined by your treatment team.   Patients discharged the day of surgery will not be allowed to drive home.  You will need someone to drive you home and stay with you the night of your procedure.    Please read over the following fact sheets that you were given:   [facility] and or MRSA Information   _x___ Take these medicines the morning of surgery with A SIP OF WATER:    1. PROPRANOLOL  2. LOSARTAN  3. SUBUTEX  4. PRILOSEC  5. TAKE AN EXTRA PRILOSEC ON Sunday  NIGHT BEFORE BED  6.  ____Fleets enema or Magnesium Citrate as directed.   ____ Use CHG Soap or sage wipes as directed on instruction sheet   __X__ Use inhalers on the day of surgery and bring to hospital day of surgery-USE ALBUTEROL INHALER AT HOME AND BRING TO HOSPITAL DAY OF SURGERY  ____ Stop metformin 2 days prior to surgery    ____ Take 1/2 of usual insulin dose the night before surgery and none on the morning of           surgery.   ____ Stop Aspirin, Coumadin, Pllavix ,Eliquis, Effient, or Pradaxa  x__ Stop Anti-inflammatories such as Advil, Aleve, Ibuprofen, Motrin, Naproxen,          Naprosyn, Goodies powders or aspirin products NOW-Ok to take Tylenol.   ____ Stop supplements until after surgery.    ____ Bring C-Pap to the hospital.

## 2016-06-22 NOTE — Pre-Procedure Instructions (Signed)
[facility]14/[facility] Component Name Value Ref Range  Vent Rate (bpm) 84   PR Interval (msec) 158   QRS Interval (msec) 72   QT Interval (msec) 372   QTc (msec) 439   Result Narrative  Normal sinus rhythm Normal ECG When compared with ECG of 22-Apr-2016 20:26, No significant change was found I reviewed and concur with this report. Electronically signed WU:[email or web address]:DONAHUE, MD, Marcial PacasIMOTHY [phone]([id number]) on 05/11/2016 7:49:51 AM  Status Results Details    [facility]")' href="[email or web address]/">Encounter Summary

## 2016-06-23 ENCOUNTER — Other Ambulatory Visit: Payer: Medicaid Other

## 2016-06-28 MED ORDER — CEFAZOLIN SODIUM-DEXTROSE 2-4 GM/100ML-% IV SOLN
2.0000 g | INTRAVENOUS | Status: AC
  Administered 2016-06-29: 2 g via INTRAVENOUS

## 2016-06-29 ENCOUNTER — Ambulatory Visit
Admission: RE | Admit: 2016-06-29 | Discharge: 2016-06-29 | Disposition: A | Discharge status: Home / Self Care | Payer: Medicaid Other | Source: Ambulatory Visit | Attending: Surgery | Admitting: Surgery

## 2016-06-29 ENCOUNTER — Ambulatory Visit: Payer: Medicaid Other | Admitting: Anesthesiology

## 2016-06-29 ENCOUNTER — Encounter: Payer: Self-pay | Admitting: *Deleted

## 2016-06-29 ENCOUNTER — Encounter
Admission: RE | Disposition: A | Discharge status: Home / Self Care | Payer: Self-pay | Source: Ambulatory Visit | Attending: Surgery

## 2016-06-29 DIAGNOSIS — J45909 Unspecified asthma, uncomplicated: Secondary | ICD-10-CM | POA: Insufficient documentation

## 2016-06-29 DIAGNOSIS — K8020 Calculus of gallbladder without cholecystitis without obstruction: Secondary | ICD-10-CM | POA: Diagnosis not present

## 2016-06-29 DIAGNOSIS — K219 Gastro-esophageal reflux disease without esophagitis: Secondary | ICD-10-CM | POA: Insufficient documentation

## 2016-06-29 DIAGNOSIS — F17210 Nicotine dependence, cigarettes, uncomplicated: Secondary | ICD-10-CM | POA: Diagnosis not present

## 2016-06-29 DIAGNOSIS — Z79899 Other long term (current) drug therapy: Secondary | ICD-10-CM | POA: Insufficient documentation

## 2016-06-29 DIAGNOSIS — K8080 Other cholelithiasis without obstruction: Secondary | ICD-10-CM | POA: Diagnosis present

## 2016-06-29 DIAGNOSIS — I10 Essential (primary) hypertension: Secondary | ICD-10-CM | POA: Diagnosis not present

## 2016-06-29 DIAGNOSIS — K8010 Calculus of gallbladder with chronic cholecystitis without obstruction: Secondary | ICD-10-CM | POA: Insufficient documentation

## 2016-06-29 HISTORY — DX: Other complications of anesthesia, initial encounter: T88.59XA

## 2016-06-29 HISTORY — PX: CHOLECYSTECTOMY: SHX55

## 2016-06-29 HISTORY — DX: Adverse effect of unspecified anesthetic, initial encounter: T41.45XA

## 2016-06-29 LAB — POCT PREGNANCY, URINE: PREG TEST UR: NEGATIVE

## 2016-06-29 SURGERY — LAPAROSCOPIC CHOLECYSTECTOMY
Anesthesia: General | Wound class: Clean Contaminated

## 2016-06-29 MED ORDER — LIDOCAINE HCL (CARDIAC) 20 MG/ML IV SOLN
INTRAVENOUS | Status: DC | PRN
  Administered 2016-06-29: 40 mg via INTRAVENOUS

## 2016-06-29 MED ORDER — FENTANYL CITRATE (PF) 100 MCG/2ML IJ SOLN
25.0000 ug | INTRAMUSCULAR | Status: DC | PRN
  Administered 2016-06-29 (×3): 25 ug via INTRAVENOUS

## 2016-06-29 MED ORDER — DEXMEDETOMIDINE HCL IN NACL 200 MCG/50ML IV SOLN
INTRAVENOUS | Status: AC
  Filled 2016-06-29: qty 50

## 2016-06-29 MED ORDER — MORPHINE SULFATE (PF) 4 MG/ML IV SOLN
1.0000 mg | INTRAVENOUS | Status: DC | PRN
  Administered 2016-06-29 (×2): 2 mg via INTRAVENOUS

## 2016-06-29 MED ORDER — DEXMEDETOMIDINE HCL IN NACL 200 MCG/50ML IV SOLN
INTRAVENOUS | Status: DC | PRN
  Administered 2016-06-29: 12 ug via INTRAVENOUS

## 2016-06-29 MED ORDER — MORPHINE SULFATE (PF) 4 MG/ML IV SOLN
INTRAVENOUS | Status: AC
  Filled 2016-06-29: qty 1

## 2016-06-29 MED ORDER — FENTANYL CITRATE (PF) 100 MCG/2ML IJ SOLN
INTRAMUSCULAR | Status: AC
  Filled 2016-06-29: qty 2

## 2016-06-29 MED ORDER — PROPOFOL 10 MG/ML IV BOLUS
INTRAVENOUS | Status: DC | PRN
  Administered 2016-06-29: 140 mg via INTRAVENOUS
  Administered 2016-06-29: 60 mg via INTRAVENOUS

## 2016-06-29 MED ORDER — SUGAMMADEX SODIUM 500 MG/5ML IV SOLN
INTRAVENOUS | Status: DC | PRN
  Administered 2016-06-29: 235 mg via INTRAVENOUS

## 2016-06-29 MED ORDER — CHLORHEXIDINE GLUCONATE CLOTH 2 % EX PADS: 6.0000 | MEDICATED_PAD | Freq: Once | CUTANEOUS | Status: DC

## 2016-06-29 MED ORDER — KETOROLAC TROMETHAMINE 30 MG/ML IJ SOLN
INTRAMUSCULAR | Status: DC | PRN
  Administered 2016-06-29: 30 mg via INTRAVENOUS

## 2016-06-29 MED ORDER — KETOROLAC TROMETHAMINE 30 MG/ML IJ SOLN
INTRAMUSCULAR | Status: AC
  Filled 2016-06-29: qty 1

## 2016-06-29 MED ORDER — MIDAZOLAM HCL 2 MG/2ML IJ SOLN
INTRAMUSCULAR | Status: AC
  Filled 2016-06-29: qty 2

## 2016-06-29 MED ORDER — MIDAZOLAM HCL 2 MG/2ML IJ SOLN
INTRAMUSCULAR | Status: DC | PRN
  Administered 2016-06-29: 2 mg via INTRAVENOUS

## 2016-06-29 MED ORDER — ROCURONIUM BROMIDE 100 MG/10ML IV SOLN
INTRAVENOUS | Status: DC | PRN
  Administered 2016-06-29: 10 mg via INTRAVENOUS
  Administered 2016-06-29: 40 mg via INTRAVENOUS

## 2016-06-29 MED ORDER — SUGAMMADEX SODIUM 500 MG/5ML IV SOLN
INTRAVENOUS | Status: AC
  Filled 2016-06-29: qty 5

## 2016-06-29 MED ORDER — BUPIVACAINE-EPINEPHRINE 0.25% -1:200000 IJ SOLN
INTRAMUSCULAR | Status: DC | PRN
  Administered 2016-06-29: 30 mL

## 2016-06-29 MED ORDER — OXYCODONE-ACETAMINOPHEN 10-325 MG PO TABS: 1.0000 | ORAL_TABLET | ORAL | 0 refills | Status: DC | PRN

## 2016-06-29 MED ORDER — CEFAZOLIN SODIUM-DEXTROSE 2-4 GM/100ML-% IV SOLN
INTRAVENOUS | Status: AC
  Filled 2016-06-29: qty 100

## 2016-06-29 MED ORDER — OXYCODONE-ACETAMINOPHEN 5-325 MG PO TABS
2.0000 | ORAL_TABLET | ORAL | Status: DC | PRN
Start: 2016-06-29 — End: 2016-06-29
  Administered 2016-06-29: 2 via ORAL

## 2016-06-29 MED ORDER — OXYCODONE-ACETAMINOPHEN 5-325 MG PO TABS
ORAL_TABLET | ORAL | Status: AC
  Administered 2016-06-29: 2 via ORAL
  Filled 2016-06-29: qty 2

## 2016-06-29 MED ORDER — LACTATED RINGERS IV SOLN
INTRAVENOUS | Status: DC
  Administered 2016-06-29 (×2): via INTRAVENOUS

## 2016-06-29 MED ORDER — PROPOFOL 10 MG/ML IV BOLUS
INTRAVENOUS | Status: AC
  Filled 2016-06-29: qty 20

## 2016-06-29 MED ORDER — FENTANYL CITRATE (PF) 100 MCG/2ML IJ SOLN: 25.0000 ug | INTRAMUSCULAR | Status: DC | PRN

## 2016-06-29 MED ORDER — FENTANYL CITRATE (PF) 100 MCG/2ML IJ SOLN
INTRAMUSCULAR | Status: DC | PRN
  Administered 2016-06-29: 50 ug via INTRAVENOUS
  Administered 2016-06-29: 75 ug via INTRAVENOUS
  Administered 2016-06-29: 25 ug via INTRAVENOUS
  Administered 2016-06-29: 50 ug via INTRAVENOUS

## 2016-06-29 MED ORDER — BUPIVACAINE-EPINEPHRINE (PF) 0.25% -1:200000 IJ SOLN
INTRAMUSCULAR | Status: AC
  Filled 2016-06-29: qty 30

## 2016-06-29 SURGICAL SUPPLY — 46 items
APPLICATOR COTTON TIP 6IN STRL (MISCELLANEOUS) ×3 IMPLANT
APPLIER CLIP 5 13 M/L LIGAMAX5 (MISCELLANEOUS) ×3
BLADE SURG 15 STRL LF DISP TIS (BLADE) ×1 IMPLANT
BLADE SURG 15 STRL SS (BLADE) ×2
CANISTER SUCT 1200ML W/VALVE (MISCELLANEOUS) ×3 IMPLANT
CHLORAPREP W/TINT 26ML (MISCELLANEOUS) ×3 IMPLANT
CHOLANGIOGRAM CATH TAUT (CATHETERS) IMPLANT
CLEANER CAUTERY TIP 5X5 PAD (MISCELLANEOUS) ×1 IMPLANT
CLIP APPLIE 5 13 M/L LIGAMAX5 (MISCELLANEOUS) ×1 IMPLANT
DECANTER SPIKE VIAL GLASS SM (MISCELLANEOUS) IMPLANT
DEVICE TROCAR PUNCTURE CLOSURE (ENDOMECHANICALS) IMPLANT
DRAPE C-ARM XRAY 36X54 (DRAPES) IMPLANT
ELECT CAUTERY BLADE 6.4 (BLADE) ×3 IMPLANT
ELECT REM PT RETURN 9FT ADLT (ELECTROSURGICAL) ×3
ELECTRODE REM PT RTRN 9FT ADLT (ELECTROSURGICAL) ×1 IMPLANT
ENDOPOUCH RETRIEVER 10 (MISCELLANEOUS) ×3 IMPLANT
GLOVE BIO SURGEON STRL SZ7 (GLOVE) ×3 IMPLANT
GOWN STRL REUS W/ TWL LRG LVL3 (GOWN DISPOSABLE) ×3 IMPLANT
GOWN STRL REUS W/TWL LRG LVL3 (GOWN DISPOSABLE) ×6
IRRIGATION STRYKERFLOW (MISCELLANEOUS) ×1 IMPLANT
IRRIGATOR STRYKERFLOW (MISCELLANEOUS) ×3
IV CATH ANGIO 12GX3 LT BLUE (NEEDLE) ×3 IMPLANT
IV NS 1000ML (IV SOLUTION) ×2
IV NS 1000ML BAXH (IV SOLUTION) ×1 IMPLANT
L-HOOK LAP DISP 36CM (ELECTROSURGICAL) ×3
LHOOK LAP DISP 36CM (ELECTROSURGICAL) ×1 IMPLANT
LIQUID BAND (GAUZE/BANDAGES/DRESSINGS) ×3 IMPLANT
NEEDLE HYPO 22GX1.5 SAFETY (NEEDLE) ×3 IMPLANT
PACK LAP CHOLECYSTECTOMY (MISCELLANEOUS) ×3 IMPLANT
PAD CLEANER CAUTERY TIP 5X5 (MISCELLANEOUS) ×2
PENCIL ELECTRO HAND CTR (MISCELLANEOUS) ×3 IMPLANT
SCISSORS METZENBAUM CVD 33 (INSTRUMENTS) ×3 IMPLANT
SLEEVE ENDOPATH XCEL 5M (ENDOMECHANICALS) ×6 IMPLANT
SOL ANTI-FOG 6CC FOG-OUT (MISCELLANEOUS) ×1 IMPLANT
SOL FOG-OUT ANTI-FOG 6CC (MISCELLANEOUS) ×2
SPONGE LAP 18X18 5 PK (GAUZE/BANDAGES/DRESSINGS) IMPLANT
STOPCOCK 3 WAY  REPLAC (MISCELLANEOUS) IMPLANT
SUT ETHIBOND 0 MO6 C/R (SUTURE) IMPLANT
SUT MNCRL AB 4-0 PS2 18 (SUTURE) ×3 IMPLANT
SUT VIC AB 0 CT2 27 (SUTURE) IMPLANT
SUT VICRYL 0 AB UR-6 (SUTURE) ×6 IMPLANT
SYR 20CC LL (SYRINGE) ×3 IMPLANT
TROCAR XCEL BLUNT TIP 100MML (ENDOMECHANICALS) ×3 IMPLANT
TROCAR XCEL NON-BLD 5MMX100MML (ENDOMECHANICALS) ×3 IMPLANT
TUBING INSUFFLATOR HI FLOW (MISCELLANEOUS) ×3 IMPLANT
WATER STERILE IRR 1000ML POUR (IV SOLUTION) ×3 IMPLANT

## 2016-06-29 NOTE — Interval H&P Note (Signed)
History and Physical Interval Note:  06/29/2016 11:22 AM  Candace Morrow  has presented today for surgery, with the diagnosis of gallstones  The various methods of treatment have been discussed with the patient and family. After consideration of risks, benefits and other options for treatment, the patient has consented to  Procedure(s): LAPAROSCOPIC CHOLECYSTECTOMY (N/A) as a surgical intervention .  The patient's history has been reviewed, patient examined, no change in status, stable for surgery.  I have reviewed the patient's chart and labs.  Questions were answered to the patient's satisfaction.     Diego F Pabon

## 2016-06-29 NOTE — Discharge Instructions (Signed)

## 2016-06-29 NOTE — Anesthesia Post-op Follow-up Note (Cosign Needed)
Anesthesia QCDR form completed.        

## 2016-06-29 NOTE — Transfer of Care (Signed)
Immediate Anesthesia Transfer of Care Note  Patient: Candace Morrow  Procedure(s) Performed: Procedure(s): LAPAROSCOPIC CHOLECYSTECTOMY (N/A)  Patient Location: PACU  Anesthesia Type:General  Level of Consciousness: sedated  Airway & Oxygen Therapy: Patient Spontanous Breathing and Patient connected to face mask oxygen  Post-op Assessment: Report given to RN and Post -op Vital signs reviewed and stable  Post vital signs: Reviewed and stable  Last Vitals:  Vitals:   06/29/16 1014 06/29/16 1312  BP: (!) 156/97 (!) 152/68  Pulse: 78 78  Resp: 16 (!) 27  Temp: 37.5 C 36.3 C    Last Pain:  Vitals:   06/29/16 1312  TempSrc:   PainSc: Asleep         Complications: No apparent anesthesia complications

## 2016-06-29 NOTE — Anesthesia Procedure Notes (Signed)
Performed by: Jayleene Glaeser       

## 2016-06-29 NOTE — Anesthesia Postprocedure Evaluation (Signed)
Anesthesia Post Note  Patient: Candace Morrow  Procedure(s) Performed: Procedure(s) (LRB): LAPAROSCOPIC CHOLECYSTECTOMY (N/A)  Patient location during evaluation: PACU Anesthesia Type: General Level of consciousness: awake and alert and oriented Pain management: pain level controlled Vital Signs Assessment: post-procedure vital signs reviewed and stable Respiratory status: spontaneous breathing Cardiovascular status: blood pressure returned to baseline Anesthetic complications: no     Last Vitals:  Vitals:   06/29/16 1330 06/29/16 1337  BP: (!) 150/89   Pulse: 72 73  Resp: 17 18  Temp:      Last Pain:  Vitals:   06/29/16 1312  TempSrc:   PainSc: Asleep                 Casy Tavano

## 2016-06-29 NOTE — Anesthesia Procedure Notes (Signed)
Procedure Name: Intubation Date/Time: 06/29/2016 12:05 PM Performed by: Allean Found Pre-anesthesia Checklist: Patient identified, Emergency Drugs available, Suction available, Patient being monitored and Timeout performed Patient Re-evaluated:Patient Re-evaluated prior to inductionPreoxygenation: Pre-oxygenation with 100% oxygen Intubation Type: IV induction Ventilation: Mask ventilation without difficulty Laryngoscope Size: Mac and 3 Grade View: Grade I Tube type: Oral Tube size: 7.0 mm Number of attempts: 1 Airway Equipment and Method: Stylet Placement Confirmation: ETT inserted through vocal cords under direct vision,  positive ETCO2 and breath sounds checked- equal and bilateral Secured at: 21 cm Tube secured with: Tape Dental Injury: Teeth and Oropharynx as per pre-operative assessment
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
## 2016-06-29 NOTE — Anesthesia Preprocedure Evaluation (Signed)
Anesthesia Evaluation  Patient identified by MRN, date of birth, ID band Patient awake  General Assessment Comment:Some increased work of breathing after last colonoscopy  Reviewed: Allergy & Precautions, NPO status , Patient's Chart, lab work & pertinent test results, reviewed documented beta blocker date and time   History of Anesthesia Complications (+) history of anesthetic complications  Airway Mallampati: II  TM Distance: >3 FB     Dental no notable dental hx.    Pulmonary asthma , Current Smoker,    Pulmonary exam normal        Cardiovascular hypertension, Pt. on medications and Pt. on home beta blockers Normal cardiovascular exam     Neuro/Psych  Headaches,    GI/Hepatic Neg liver ROS, GERD  Medicated,  Endo/Other  negative endocrine ROS  Renal/GU negative Renal ROS  negative genitourinary   Musculoskeletal negative musculoskeletal ROS (+) Arthritis , Osteoarthritis,  Lower back pain   Abdominal Normal abdominal exam  (+)   Peds negative pediatric ROS (+)  Hematology negative hematology ROS (+)   Anesthesia Other Findings   Reproductive/Obstetrics                             Anesthesia Physical  Anesthesia Plan  ASA: III  Anesthesia Plan: General   Post-op Pain Management:    Induction: Intravenous  Airway Management Planned: Oral ETT  Additional Equipment:   Intra-op Plan:   Post-operative Plan: Extubation in OR  Informed Consent: I have reviewed the patients History and Physical, chart, labs and discussed the procedure including the risks, benefits and alternatives for the proposed anesthesia with the patient or authorized representative who has indicated his/her understanding and acceptance.   Dental advisory given  Plan Discussed with: CRNA and Surgeon  Anesthesia Plan Comments:         Anesthesia Quick Evaluation

## 2016-06-29 NOTE — Op Note (Signed)
Laparoscopic Cholecystectomy  Pre-operative Diagnosis: Symptomatic cholelithiasis  Post-operative Diagnosis: same  Procedure: lap chole  Surgeon: Sterling Bigiego Baeleigh Devincent, MD FACS  Anesthesia: Gen. with endotracheal tube   Findings: GB  Estimated Blood Loss: 5cc         Drains: none         Specimens: Gallbladder           Complications: none   Procedure Details  The patient was seen again in the Holding Room. The benefits, complications, treatment options, and expected outcomes were discussed with the patient. The risks of bleeding, infection, recurrence of symptoms, failure to resolve symptoms, bile duct damage, bile duct leak, retained common bile duct stone, bowel injury, any of which could require further surgery and/or ERCP, stent, or papillotomy were reviewed with the patient. The likelihood of improving the patient's symptoms with return to their baseline status is good.  The patient and/or family concurred with the proposed plan, giving informed consent.  The patient was taken to Operating Room, identified as Kallie Lockshonda L Bifulco and the procedure verified as Laparoscopic Cholecystectomy.  A Time Out was held and the above information confirmed.  Prior to the induction of general anesthesia, antibiotic prophylaxis was administered. VTE prophylaxis was in place. General endotracheal anesthesia was then administered and tolerated well. After the induction, the abdomen was prepped with Chloraprep and draped in the sterile fashion. The patient was positioned in the supine position.  Local anesthetic  was injected into the skin near the umbilicus and an incision made. Cut down technique was used to enter the abdominal cavity and a Hasson trochar was placed after two vicryl stitches were anchored to the fascia. Pneumoperitoneum was then created with CO2 and tolerated well without any adverse changes in the patient's vital signs.  Three 5-mm ports were placed in the right upper quadrant all under  direct vision. All skin incisions  were infiltrated with a local anesthetic agent before making the incision and placing the trocars.   The patient was positioned  in reverse Trendelenburg, tilted slightly to the patient's left.  The gallbladder was identified, the fundus grasped and retracted cephalad. Adhesions were lysed bluntly. The infundibulum was grasped and retracted laterally, exposing the peritoneum overlying the triangle of Calot. This was then divided and exposed in a blunt fashion. An extended critical view of the cystic duct and cystic artery was obtained.  The cystic duct was clearly identified and bluntly dissected.   Artery and duct were double clipped and divided.  The gallbladder was taken from the gallbladder fossa in a retrograde fashion with the electrocautery. The gallbladder was removed and placed in an Endocatch bag. The liver bed was irrigated and inspected. Hemostasis was achieved with the electrocautery. Copious irrigation was utilized and was repeatedly aspirated until clear.  The gallbladder and Endocatch sac were removed.  Inspection of the right upper quadrant was performed. No bleeding, bile duct injury or leak, or bowel injury was noted. Pneumoperitoneum was released.  The umbilical port site was closed with figure-of-eight 0 Vicryl sutures. 4-0 subcuticular Monocryl was used to close the skin. Dermabond was  applied.  The patient was then extubated and brought to the recovery room in stable condition. Sponge, lap, and needle counts were correct at closure and at the conclusion of the case.               Sterling Bigiego Narayan Scull, MD, FACS

## 2016-06-30 ENCOUNTER — Encounter: Payer: Self-pay | Admitting: Surgery

## 2016-06-30 LAB — SURGICAL PATHOLOGY

## 2016-07-01 ENCOUNTER — Other Ambulatory Visit: Payer: Self-pay

## 2016-07-13 ENCOUNTER — Ambulatory Visit (INDEPENDENT_AMBULATORY_CARE_PROVIDER_SITE_OTHER): Payer: Medicaid Other | Admitting: General Surgery

## 2016-07-13 ENCOUNTER — Encounter: Payer: Self-pay | Admitting: General Surgery

## 2016-07-13 VITALS — BP 160/88 | HR 76 | Temp 97.7°F | Wt 263.0 lb

## 2016-07-13 DIAGNOSIS — Z4889 Encounter for other specified surgical aftercare: Secondary | ICD-10-CM

## 2016-07-13 MED ORDER — OXYCODONE-ACETAMINOPHEN 5-325 MG PO TABS: 1.0000 | ORAL_TABLET | Freq: Four times a day (QID) | ORAL | 0 refills | Status: DC | PRN
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
## 2016-07-13 NOTE — Patient Instructions (Addendum)
Please call our office with any questions or concerns.  You are okay to submerge in a tub, hot tub, or pool incisions are completely sealed.  Use sun block to incision area over the next year if this area will be exposed to sun. This helps decrease scarring.  You may resume your normal activities on 07/13/2016. At that time- Listen to your body when lifting, if you have pain when lifting, stop and then try again in a few days. Pain after doing exercises or activities of daily living is normal as you get back in to your normal routine.  If you develop redness, drainage, or pain at incision sites- call our office immediately and speak with a nurse.  You could also buy TENS Unit to help with your pain. This could be bought at any pharmacy since it's an over-the counter medication.

## 2016-07-23 DIAGNOSIS — I10 Essential (primary) hypertension: Secondary | ICD-10-CM

## 2016-07-23 DIAGNOSIS — J45909 Unspecified asthma, uncomplicated: Secondary | ICD-10-CM | POA: Insufficient documentation

## 2016-07-24 ENCOUNTER — Encounter: Payer: Medicaid Other | Admitting: Surgery

## 2016-07-28 ENCOUNTER — Encounter: Payer: Self-pay | Admitting: Surgery

## 2016-07-28 ENCOUNTER — Ambulatory Visit (INDEPENDENT_AMBULATORY_CARE_PROVIDER_SITE_OTHER): Payer: Medicaid Other | Admitting: Surgery

## 2016-07-28 VITALS — BP 136/83 | HR 85 | Temp 98.5°F | Ht 65.0 in | Wt 256.8 lb

## 2016-07-28 DIAGNOSIS — Z09 Encounter for follow-up examination after completed treatment for conditions other than malignant neoplasm: Secondary | ICD-10-CM

## 2016-07-28 NOTE — Patient Instructions (Signed)
You may resume your normal activities at this time. Please call our office if you have questions or concerns.

## 2016-07-28 NOTE — Progress Notes (Signed)
07/28/2016  HPI: Patient is status post laparoscopic cholecystectomy with Dr. Everlene Farrier on 3/5. She was seen in the clinic on 3/19 due to concerns for worsening right-sided abdominal pain which was acute in onset. This was deemed to be more likely due to costochondritis. She presents today for further follow-up. She reports that now she is not having any pain whatsoever except for some mild twinges sporadically on the right side. Otherwise denies any fevers, chills, chest pain, shortness of breath, nausea, vomiting. Reports good appetite and regular bowel movements.  Vital signs: BP 136/83   Pulse 85   Temp 98.5 F (36.9 C) (Oral)   Ht  (1.651 m)   Wt 116.5 kg (256 lb 12.8 oz)   BMI 42.73 kg/m    Physical Exam: Constitutional: No acute distress Abdomen:  Soft, nondistended, nontender to palpation. All 4 incisions are clean dry and intact with no evidence of infection  Assessment/Plan: 36 year old female status post laparoscopic cholecystectomy.  -Patient's healing well with no further acute issues. Have instructed the patient that she may now resume all her regular activities. She may apply vitamin E over the wounds if she wishes to help with the scarring process. -Patient may follow-up on an as needed basis.   Howie Ill, MD [facility]

## 2016-09-08 ENCOUNTER — Telehealth: Payer: Self-pay | Admitting: Gastroenterology

## 2016-09-08 NOTE — Telephone Encounter (Signed)
Doristine MangoElizabeth White, NP with [facility] called and wants to know if Candace LoserRhonda should stay on PPI omeprazole (PRILOSEC) 20 MG capsule or wean her off and put her on  A H2 blocker. Please call Lanora Manislizabeth back at [phone].

## 2016-09-11 ENCOUNTER — Telehealth: Payer: Self-pay

## 2016-09-11 NOTE — Telephone Encounter (Signed)
Forwarded message to Dr. Tobi BastosAnna for direction.   Doristine MangoElizabeth White, NP with [facility] called and wants to know if Candace Morrow should stay on PPI omeprazole (PRILOSEC) 20 MG capsule or wean her off and put her on  A H2 blocker. Please call Lanora Manislizabeth back at [phone].

## 2016-09-14 NOTE — Telephone Encounter (Signed)
If doing well on PPI  The next step would be   1. Stop PPI 2. Start on zantac 150 BID and if does well on that to change to Zantac 150 mg once a day a night after a few weeks

## 2016-09-15 ENCOUNTER — Telehealth: Payer: Self-pay

## 2016-09-15 NOTE — Telephone Encounter (Signed)
LVM for patient callback to change medication and treatment method per Dr. Tobi BastosAnna.   If doing well on PPI  The next step would be   1. Stop PPI 2. Start on zantac 150 BID and if does well on that to change to Zantac 150 mg once a day a night after a few weeks

## 2017-01-22 DIAGNOSIS — J4520 Mild intermittent asthma, uncomplicated: Secondary | ICD-10-CM | POA: Insufficient documentation

## 2018-05-05 ENCOUNTER — Inpatient Hospital Stay: Payer: Medicaid Other | Attending: Hematology and Oncology | Admitting: Hematology and Oncology

## 2018-05-05 DIAGNOSIS — D72829 Elevated white blood cell count, unspecified: Secondary | ICD-10-CM | POA: Insufficient documentation
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
## 2018-05-19 DIAGNOSIS — F419 Anxiety disorder, unspecified: Secondary | ICD-10-CM | POA: Insufficient documentation

## 2018-09-15 ENCOUNTER — Other Ambulatory Visit: Payer: Self-pay

## 2018-09-15 ENCOUNTER — Ambulatory Visit (INDEPENDENT_AMBULATORY_CARE_PROVIDER_SITE_OTHER): Payer: Medicaid Other | Admitting: Surgery

## 2018-09-15 ENCOUNTER — Encounter: Payer: Self-pay | Admitting: Surgery

## 2018-09-15 VITALS — BP 168/80 | HR 80 | Temp 96.1°F | Ht 65.5 in | Wt 266.0 lb

## 2018-09-15 DIAGNOSIS — L918 Other hypertrophic disorders of the skin: Secondary | ICD-10-CM | POA: Diagnosis not present

## 2018-09-15 DIAGNOSIS — L723 Sebaceous cyst: Secondary | ICD-10-CM | POA: Diagnosis not present

## 2018-09-15 NOTE — Progress Notes (Signed)
Surgical Clinic Progress/Follow-up Note   HPI:  38 y.o. Female known to our practice s/p laparoscopic cholecystectomy ([place], 2018) presents to clinic for evaluation of her posterior neck mass. Patient reports she first noticed a small "pimple"-sized lesion 3 - 4 months ago, but this has progressively increased in size and has become increasingly painful. She has not since noticed any drainage from the lesion, but says she can smell a foul odor when she rubs her finger on it. She presents due to concern regarding lesion's etiology and to request removal due to the increasing pain associated with the enlarged mass. She otherwise denies any fever/chills or redness and denies CP or SOB. Of note, she also states there is a small pigmented skin lesion adjacent to the mass that has recently appeared larger in size and also causes her pain, though much less than the enlarged adjacent mass. She requests excision of both, the latter due to pain and concern regarding possibility of skin cancer. She denies any personal or family history of skin cancer.  Review of Systems:  Constitutional: denies any other weight loss, fever, chills, or sweats  Eyes: denies any other vision changes, history of eye injury  ENT: denies sore throat, hearing problems  Respiratory: denies shortness of breath, wheezing  Cardiovascular: denies chest pain, palpitations  Gastrointestinal: abdominal pain, N/V, and bowel function as per HPI Musculoskeletal: denies any other joint pains or cramps  Skin: Denies any other rashes or skin discolorations  Neurological: denies any other headache, dizziness, weakness  Psychiatric: denies any other depression, anxiety  All other review of systems: otherwise negative   Vital Signs:  BP (!) 168/80   Pulse 80   Temp (!) 96.1 F (35.6 C) (Temporal)   Ht 5' 5.5" (1.664 m)   Wt 266 lb (120.7 kg)   SpO2 96%   BMI 43.59 kg/m    Physical Exam:  Constitutional:  -- Obese body habitus  --  Awake, alert, and oriented x3  Eyes:  -- Pupils equally round and reactive to light  -- No scleral icterus  Ear, nose, throat:  -- No jugular venous distension  -- No nasal drainage, bleeding Pulmonary:  -- No crackles -- Equal breath sounds bilaterally -- Breathing non-labored at rest Cardiovascular:  -- S1, S2 present  -- No pericardial rubs  Gastrointestinal:  -- Soft, nontender, non-distended, no guarding/rebound  -- No abdominal masses appreciated, pulsatile or otherwise  Musculoskeletal / Integumentary:  -- Wounds or skin discoloration: 1.5 cm Right of mid-posterior neck moderately tender to palpation mobile subcutaneous mass with a central pore without drainage or surrounding erythema + Right posterior mildly tender to palpation pigmented skin tag (~1 cm lateral to the subcutaneous cyst)  -- Extremities: B/L UE and LE FROM, hands and feet warm, no edema  Neurologic:  -- Motor function: intact and symmetric  -- Sensation: intact and symmetric   Laboratory studies:  CBC: No results found for: WBC, RBC BMP: No results found for: GLUCOSE, POTASSIUM, CHLORIDE, CO2, BUN, CREATININE, CALCIUM   Imaging: No recent pertinent imaging studies available for review at this time   Assessment:  38 y.o. yo Female with a problem list including...  Patient Active Problem List   Diagnosis Date Noted  . Leukocytosis 05/05/2018  . Asthma   . Hypertension   . Calculus of gallbladder without cholecystitis without obstruction   . Change in bowel habits   . Dyspepsia   . Gastritis without bleeding   . Chronic insomnia 08/06/2015  .  Essential hypertension 08/06/2015  . Gastroesophageal reflux disease 08/06/2015  . Opioid dependence on agonist therapy (HCC) 08/06/2015  . Abscess of leg, left 12/21/2014  . Abscess of nipple 12/21/2014  . Encounter for Depo-Provera contraception 12/15/2012  . Sprain and strain of sacrum 01/18/2011  . Chronic mixed headache syndrome 01/16/2011  . Low back  pain 10/30/2010  . Pain 10/30/2010  . Sprain and strain of hip and thigh 10/30/2010    presents to clinic for follow-up evaluation of her enlarged and increasingly painful 1.5 cm Right of midline posterior neck subcutaneous mass, consistent with sebaceous cyst, without evidence of infection and nearby/adjacent Right posterior increasingly painful non-infected pigmented skin tag, complicated by comorbidities including obesity (BMI 44) and above comorbidities.  Plan:   - differential diagnoses discussed   - natural history of sebaceous cysts discussed  - all risks, benefits, and alternatives to in-office excision of Right of midline posterior neck subcutaneous mass (most likely sebaceous cyst) and possible excision of symptomatic Right posterior neck pigmented skin tag were discussed with the patient, all of her questions were answered to her expressed satisfaction, patient expresses she wishes to proceed (understands excision of nearby, but not immediately adjacent, pigmented skin tag may need to be performed separately if cannot be incorporated into same incision, though is too close to first incision for appropriate healing), and informed consent was obtained.  - will plan for in-office excision of increasingly symptomatic enlarged Right of midline posterior neck sebaceous cyst and possible excision of adjacent less severely increasingly symptomatic (painful) Right posterior neck pigmented skin tag  - anticipate return to clinic 2 weeks following above elective procedure  - instructed to call office if any questions or concerns  All of the above recommendations were discussed with the patient, and all of patient's questions were answered to her expressed satisfaction.  -- Scherrie Gerlach Earlene Plater, MD, RPVI [place] - Partnering for exceptional care. Office: [phone]

## 2018-09-15 NOTE — Patient Instructions (Addendum)
The patient is aware to call back for any questions or new concerns. Schedule excision of cyst

## 2018-09-29 ENCOUNTER — Ambulatory Visit: Payer: Self-pay | Admitting: Surgery

## 2019-01-12 IMAGING — US US ABDOMEN COMPLETE
1 series · 14 of 25 positions shown · non-contrast
Comparison: CT from 03/31/2016

CLINICAL DATA: Right upper quadrant abdominal pain for 1 week.

EXAM:
ABDOMEN ULTRASOUND COMPLETE

[Series 1: us abdomen complete · 0.26mm/px · 14 of 132 slices shown]
[im 1/132]
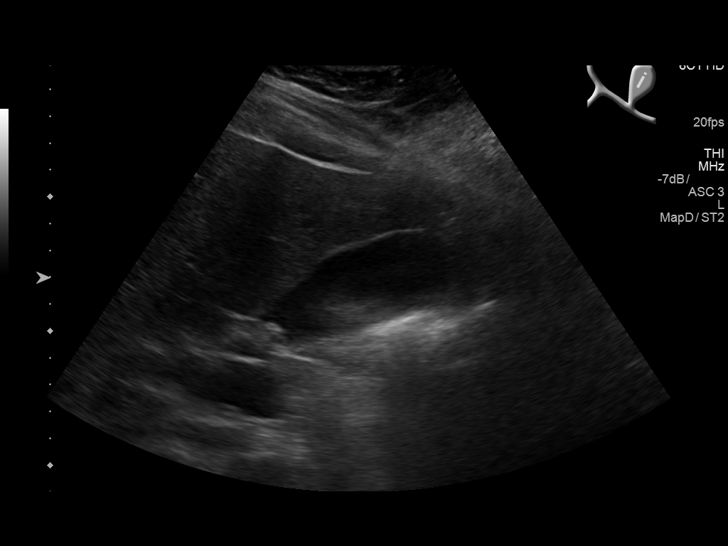
[im 11/132]
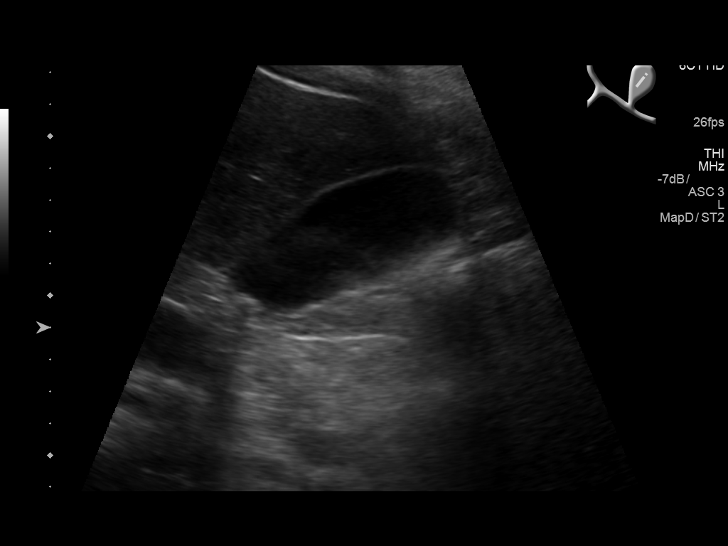
[im 22/132]
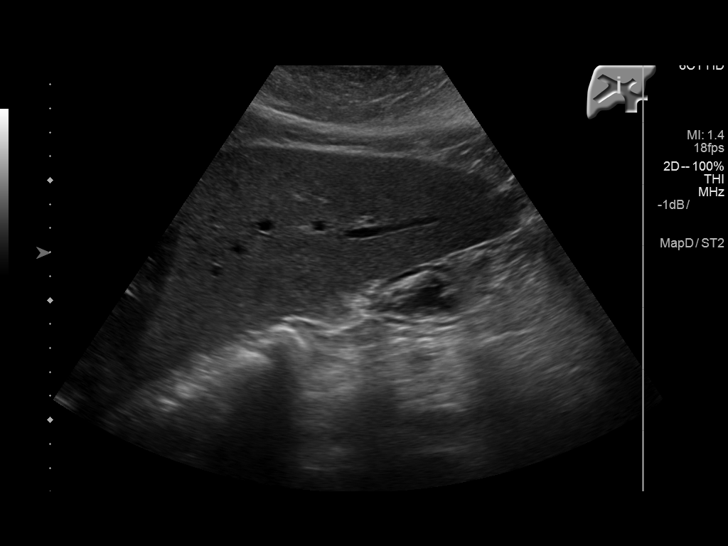
[im 33/132]
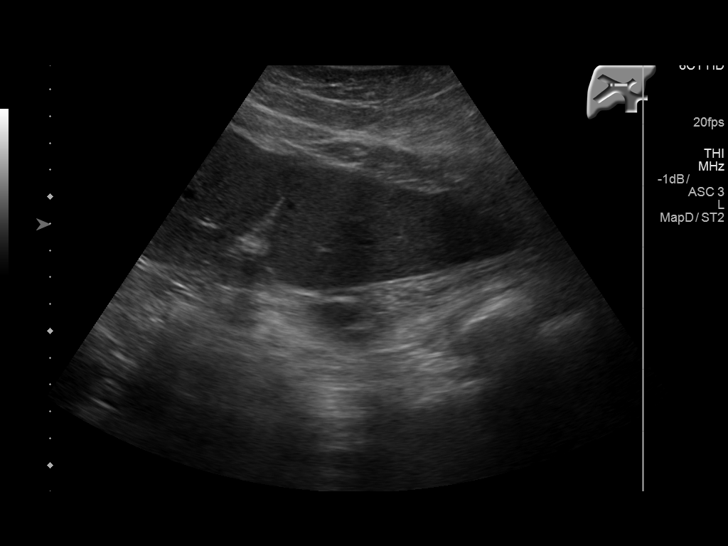
[im 44/132]
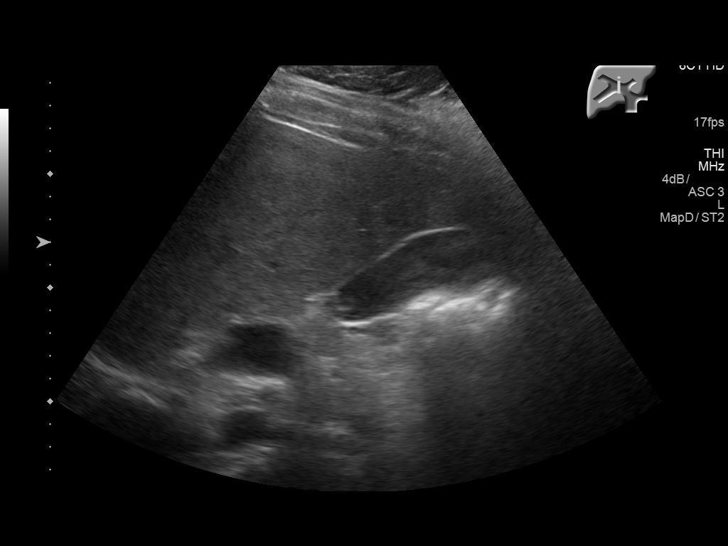
[im 50/132]
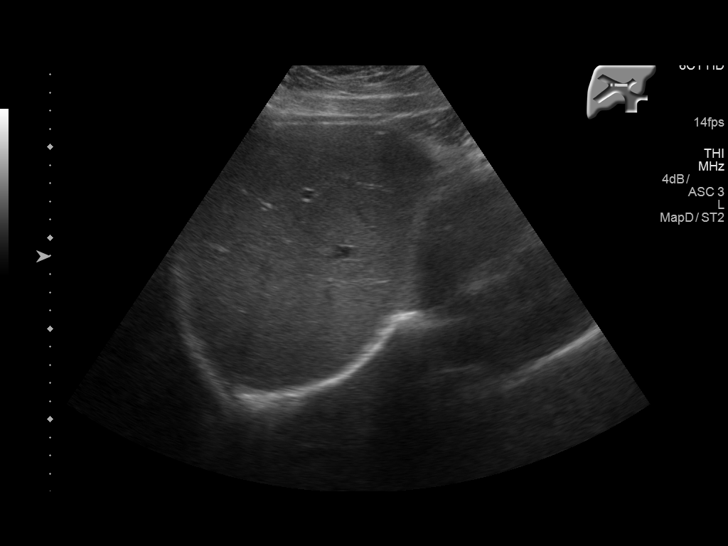
[im 61/132]
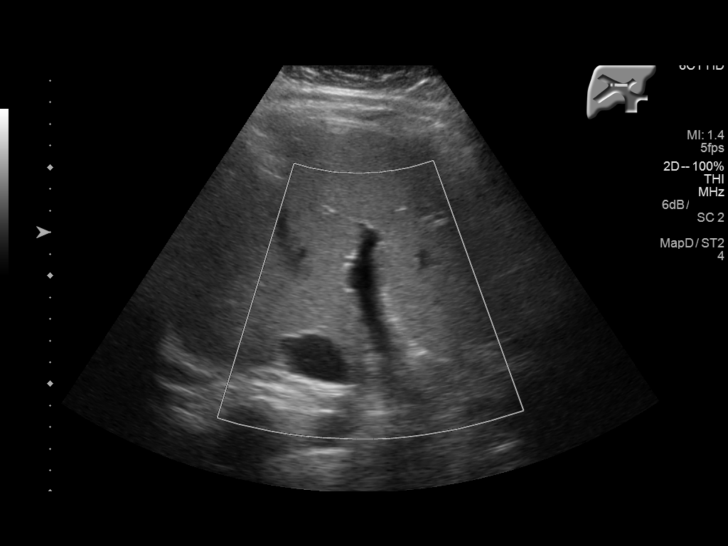
[im 71/132]
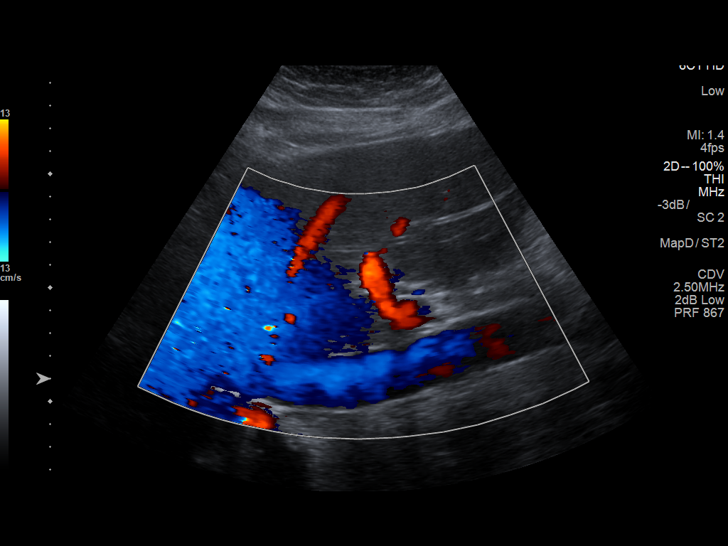
[im 82/132]
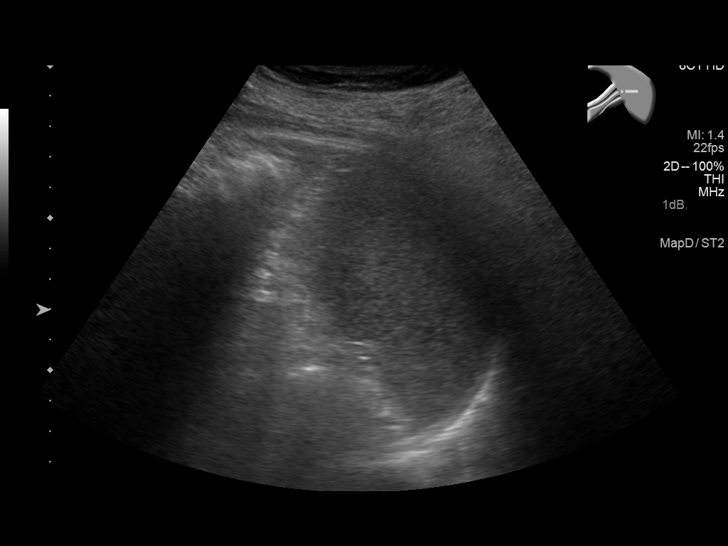
[im 88/132]
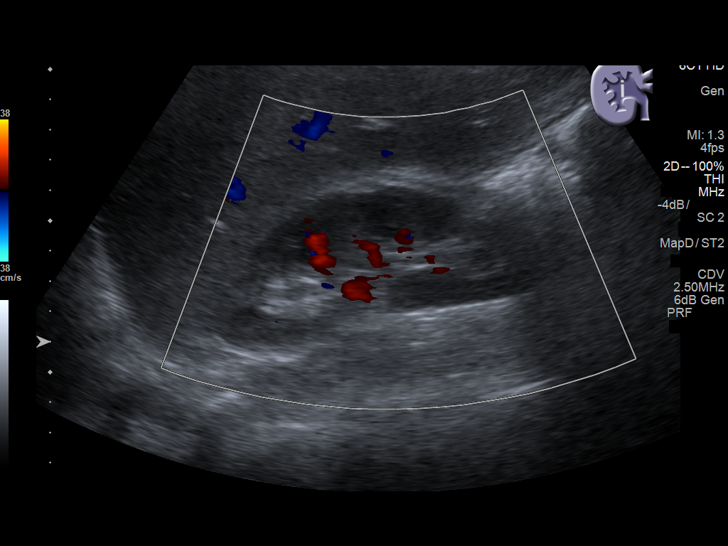
[im 99/132]
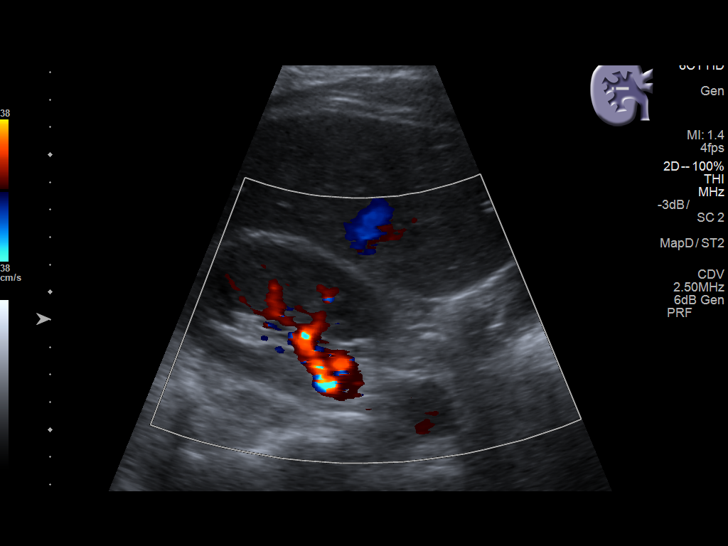
[im 110/132]
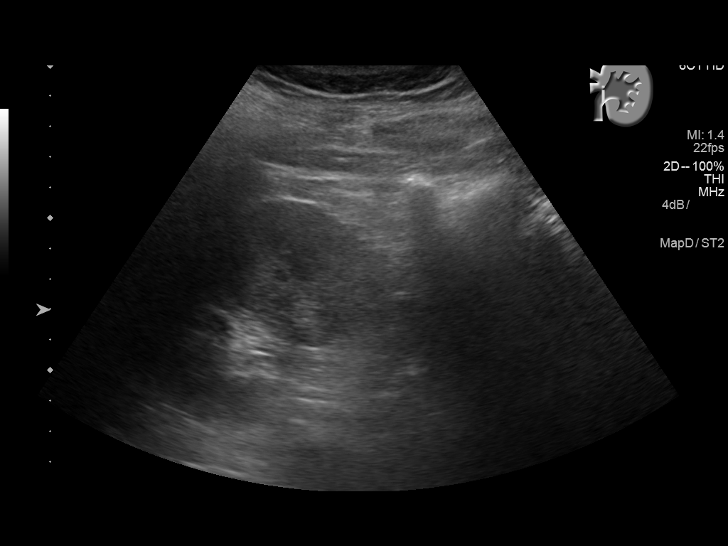
[im 121/132]
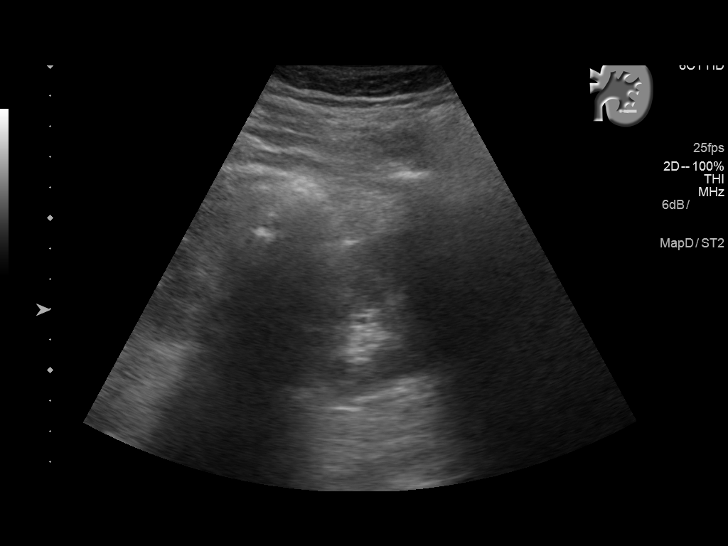
[im 132/132]
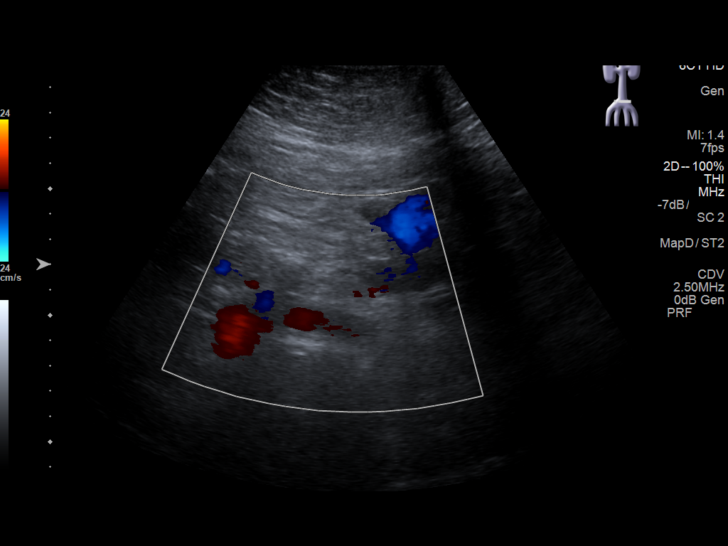

[14 of 25 positions shown; findings below may reference images not displayed]

FINDINGS: Gallbladder: Gallstones are observed measuring up to 6 mm in
diameter. Questionable sludge. The sonographer indicates that
sonographic Murphy's sign was present. No gallbladder wall
thickening or pericholecystic fluid.

Common bile duct: Diameter: 4 mm

Liver: No focal lesion identified. Within normal limits in
parenchymal echogenicity.

IVC: No abnormality visualized.

Pancreas: Visualized portion unremarkable.

Spleen: Size and appearance within normal limits.

Right Kidney: Length: 11.4 cm. Echogenicity within normal limits. No
mass or hydronephrosis visualized.

Left Kidney: Length: 11.6 cm. Echogenicity within normal limits. No
mass or hydronephrosis visualized.

Abdominal aorta: No aneurysm visualized. Partially obscured by bowel
gas.

Other findings: None.
IMPRESSION: 1. Cholelithiasis with sonographic Murphy sign but no gallbladder
wall thickening or pericholecystic fluid. Correlate clinically in
assessing for acute cholecystitis.

## 2020-12-25 ENCOUNTER — Encounter: Payer: Self-pay | Admitting: Oncology

## 2020-12-25 ENCOUNTER — Other Ambulatory Visit: Payer: Self-pay

## 2020-12-25 ENCOUNTER — Ambulatory Visit: Payer: Medicaid Other

## 2020-12-25 ENCOUNTER — Inpatient Hospital Stay: Payer: Medicaid Other | Attending: Oncology | Admitting: Oncology

## 2020-12-25 VITALS — BP 124/70 | HR 80 | Temp 98.5°F | Resp 18 | Wt 270.0 lb

## 2020-12-25 DIAGNOSIS — E538 Deficiency of other specified B group vitamins: Secondary | ICD-10-CM | POA: Diagnosis not present

## 2020-12-25 DIAGNOSIS — Z79899 Other long term (current) drug therapy: Secondary | ICD-10-CM | POA: Diagnosis not present

## 2020-12-25 DIAGNOSIS — D509 Iron deficiency anemia, unspecified: Secondary | ICD-10-CM

## 2020-12-25 LAB — CBC WITH DIFFERENTIAL/PLATELET
Abs Immature Granulocytes: 0.04 10*3/uL (ref 0.00–0.07)
Basophils Absolute: 0 10*3/uL (ref 0.0–0.1)
Basophils Relative: 0 %
Eosinophils Absolute: 0.2 10*3/uL (ref 0.0–0.5)
Eosinophils Relative: 2 %
HCT: 37.8 % (ref 36.0–46.0)
Hemoglobin: 11.9 g/dL — ABNORMAL LOW (ref 12.0–15.0)
Immature Granulocytes: 0 %
Lymphocytes Relative: 28 %
Lymphs Abs: 3.2 10*3/uL (ref 0.7–4.0)
MCH: 24.8 pg — ABNORMAL LOW (ref 26.0–34.0)
MCHC: 31.5 g/dL (ref 30.0–36.0)
MCV: 78.9 fL — ABNORMAL LOW (ref 80.0–100.0)
Monocytes Absolute: 0.7 10*3/uL (ref 0.1–1.0)
Monocytes Relative: 6 %
Neutro Abs: 7.1 10*3/uL (ref 1.7–7.7)
Neutrophils Relative %: 64 %
Platelets: 289 10*3/uL (ref 150–400)
RBC: 4.79 MIL/uL (ref 3.87–5.11)
RDW: 17.2 % — ABNORMAL HIGH (ref 11.5–15.5)
WBC: 11.3 10*3/uL — ABNORMAL HIGH (ref 4.0–10.5)
nRBC: 0 % (ref 0.0–0.2)

## 2020-12-25 LAB — COMPREHENSIVE METABOLIC PANEL
ALT: 20 U/L (ref 0–44)
AST: 23 U/L (ref 15–41)
Albumin: 3.7 g/dL (ref 3.5–5.0)
Alkaline Phosphatase: 96 U/L (ref 38–126)
Anion gap: 5 (ref 5–15)
BUN: 7 mg/dL (ref 6–20)
CO2: 27 mmol/L (ref 22–32)
Calcium: 8.9 mg/dL (ref 8.9–10.3)
Chloride: 102 mmol/L (ref 98–111)
Creatinine, Ser: 0.88 mg/dL (ref 0.44–1.00)
GFR, Estimated: >60 mL/min (ref >60)
Glucose, Bld: 135 mg/dL — ABNORMAL HIGH (ref 70–99)
Potassium: 3.5 mmol/L (ref 3.5–5.1)
Sodium: 134 mmol/L — ABNORMAL LOW (ref 135–145)
Total Bilirubin: 0.4 mg/dL (ref 0.3–1.2)
Total Protein: 7.2 g/dL (ref 6.5–8.1)

## 2020-12-25 LAB — RETICULOCYTES
Immature Retic Fract: 16.2 % — ABNORMAL HIGH (ref 2.3–15.9)
RBC.: 4.71 MIL/uL (ref 3.87–5.11)
Retic Count, Absolute: 65 10*3/uL (ref 19.0–186.0)
Retic Ct Pct: 1.4 % (ref 0.4–3.1)

## 2020-12-25 LAB — IRON AND TIBC
Iron: 27 ug/dL — ABNORMAL LOW (ref 28–170)
Saturation Ratios: 7 % — ABNORMAL LOW (ref 10.4–31.8)
TIBC: 391 ug/dL (ref 250–450)
UIBC: 364 ug/dL

## 2020-12-25 LAB — FERRITIN: Ferritin: 20 ng/mL (ref 11–307)

## 2020-12-25 LAB — FOLATE: Folate: 9.4 ng/mL (ref >5.9)

## 2020-12-25 LAB — TSH: TSH: 1.037 u[IU]/mL (ref 0.350–4.500)

## 2020-12-25 LAB — VITAMIN B12: Vitamin B-12: 122 pg/mL — ABNORMAL LOW (ref 180–914)
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
## 2020-12-26 ENCOUNTER — Encounter: Payer: Self-pay | Admitting: Oncology

## 2020-12-26 DIAGNOSIS — D509 Iron deficiency anemia, unspecified: Secondary | ICD-10-CM | POA: Insufficient documentation

## 2020-12-26 LAB — HAPTOGLOBIN: Haptoglobin: 209 mg/dL (ref 33–278)

## 2020-12-29 ENCOUNTER — Encounter: Payer: Self-pay | Admitting: Oncology

## 2021-01-02 ENCOUNTER — Telehealth: Payer: Self-pay

## 2021-01-02 NOTE — Telephone Encounter (Signed)
-----   Message from Wyline Mood, MD sent at 12/30/2020 10:58 AM EDT ----- [name] offer new patient appointment please   ----- Message ----- From: Creig Hines, MD Sent: 12/29/2020   8:20 PM EDT To: Wyline Mood, MD

## 2021-01-02 NOTE — Telephone Encounter (Signed)
Patient was contacted and she was scheduled to come in on 01/13/2021 at 2:45 PM with Dr. Tobi Bastos.

## 2021-01-03 ENCOUNTER — Other Ambulatory Visit: Payer: Self-pay

## 2021-01-03 ENCOUNTER — Inpatient Hospital Stay: Payer: Medicaid Other | Attending: Oncology

## 2021-01-03 VITALS — BP 129/78 | HR 78 | Temp 97.0°F | Resp 18

## 2021-01-03 DIAGNOSIS — D509 Iron deficiency anemia, unspecified: Secondary | ICD-10-CM | POA: Insufficient documentation

## 2021-01-03 MED ORDER — CYANOCOBALAMIN 1000 MCG/ML IJ SOLN
1000.0000 ug | INTRAMUSCULAR | Status: AC
  Administered 2021-01-03: 1000 ug via INTRAMUSCULAR
  Filled 2021-01-03: qty 1

## 2021-01-03 MED ORDER — SODIUM CHLORIDE 0.9 % IV SOLN: 200.0000 mg | INTRAVENOUS | Status: DC

## 2021-01-03 MED ORDER — SODIUM CHLORIDE 0.9 % IV SOLN
Freq: Once | INTRAVENOUS | Status: AC
  Filled 2021-01-03: qty 250

## 2021-01-03 MED ORDER — IRON SUCROSE 20 MG/ML IV SOLN
200.0000 mg | Freq: Once | INTRAVENOUS | Status: AC
  Administered 2021-01-03: 200 mg via INTRAVENOUS
  Filled 2021-01-03: qty 10

## 2021-01-03 NOTE — Patient Instructions (Signed)

## 2021-01-07 ENCOUNTER — Other Ambulatory Visit: Payer: Self-pay

## 2021-01-07 ENCOUNTER — Inpatient Hospital Stay: Payer: Medicaid Other

## 2021-01-07 VITALS — BP 136/74 | HR 86 | Temp 98.0°F | Resp 18

## 2021-01-07 DIAGNOSIS — D509 Iron deficiency anemia, unspecified: Secondary | ICD-10-CM | POA: Diagnosis not present

## 2021-01-07 MED ORDER — SODIUM CHLORIDE 0.9 % IV SOLN
Freq: Once | INTRAVENOUS | Status: AC
  Filled 2021-01-07: qty 250

## 2021-01-07 MED ORDER — IRON SUCROSE 20 MG/ML IV SOLN
200.0000 mg | Freq: Once | INTRAVENOUS | Status: AC
  Administered 2021-01-07: 200 mg via INTRAVENOUS
  Filled 2021-01-07: qty 10

## 2021-01-07 MED ORDER — SODIUM CHLORIDE 0.9 % IV SOLN: 200.0000 mg | INTRAVENOUS | Status: DC

## 2021-01-09 ENCOUNTER — Other Ambulatory Visit: Payer: Self-pay

## 2021-01-09 ENCOUNTER — Inpatient Hospital Stay: Payer: Medicaid Other

## 2021-01-09 VITALS — BP 121/75 | HR 75 | Temp 97.5°F | Resp 18

## 2021-01-09 DIAGNOSIS — D509 Iron deficiency anemia, unspecified: Secondary | ICD-10-CM | POA: Diagnosis not present

## 2021-01-09 MED ORDER — SODIUM CHLORIDE 0.9 % IV SOLN: 200.0000 mg | INTRAVENOUS | Status: DC

## 2021-01-09 MED ORDER — IRON SUCROSE 20 MG/ML IV SOLN
200.0000 mg | Freq: Once | INTRAVENOUS | Status: AC
  Administered 2021-01-09: 200 mg via INTRAVENOUS
  Filled 2021-01-09: qty 10

## 2021-01-09 MED ORDER — SODIUM CHLORIDE 0.9 % IV SOLN
Freq: Once | INTRAVENOUS | Status: AC
Start: 2021-01-09 — End: 2021-01-09
  Filled 2021-01-09: qty 250

## 2021-01-13 ENCOUNTER — Ambulatory Visit: Payer: Medicaid Other | Admitting: Gastroenterology

## 2021-01-13 ENCOUNTER — Encounter: Payer: Self-pay | Admitting: Gastroenterology

## 2021-01-13 ENCOUNTER — Other Ambulatory Visit: Payer: Self-pay

## 2021-01-13 VITALS — BP 114/82 | HR 78 | Temp 98.1°F | Wt 270.0 lb

## 2021-01-13 DIAGNOSIS — D509 Iron deficiency anemia, unspecified: Secondary | ICD-10-CM

## 2021-01-13 MED ORDER — CLENPIQ 10-3.5-12 MG-GM -GM/160ML PO SOLN: ORAL | 0 refills | Status: AC
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
## 2021-01-14 ENCOUNTER — Inpatient Hospital Stay: Payer: Medicaid Other

## 2021-01-14 VITALS — BP 129/76 | HR 80 | Resp 18

## 2021-01-14 DIAGNOSIS — D509 Iron deficiency anemia, unspecified: Secondary | ICD-10-CM

## 2021-01-14 LAB — H. PYLORI BREATH TEST: H pylori Breath Test: NEGATIVE

## 2021-01-14 MED ORDER — SODIUM CHLORIDE 0.9 % IV SOLN: 200.0000 mg | INTRAVENOUS | Status: DC

## 2021-01-14 MED ORDER — IRON SUCROSE 20 MG/ML IV SOLN
200.0000 mg | Freq: Once | INTRAVENOUS | Status: AC
  Administered 2021-01-14: 200 mg via INTRAVENOUS
  Filled 2021-01-14: qty 10

## 2021-01-14 MED ORDER — SODIUM CHLORIDE 0.9 % IV SOLN
Freq: Once | INTRAVENOUS | Status: AC
  Filled 2021-01-14: qty 250

## 2021-01-15 LAB — URINALYSIS
Bilirubin, UA: NEGATIVE
Glucose, UA: NEGATIVE
Ketones, UA: NEGATIVE
Nitrite, UA: NEGATIVE
Protein,UA: NEGATIVE
RBC, UA: NEGATIVE
Specific Gravity, UA: 1.022 (ref 1.005–1.030)
Urobilinogen, Ur: 1 mg/dL (ref 0.2–1.0)
pH, UA: 5.5 (ref 5.0–7.5)

## 2021-01-15 LAB — CELIAC DISEASE AB SCREEN W/RFX
Antigliadin Abs, IgA: 3 units (ref 0–19)
IgA/Immunoglobulin A, Serum: 260 mg/dL (ref 87–352)
Transglutaminase IgA: <2 U/mL (ref 0–3)

## 2021-01-16 ENCOUNTER — Inpatient Hospital Stay: Payer: Medicaid Other

## 2021-01-16 DIAGNOSIS — D509 Iron deficiency anemia, unspecified: Secondary | ICD-10-CM

## 2021-01-16 MED ORDER — SODIUM CHLORIDE 0.9 % IV SOLN: 200.0000 mg | INTRAVENOUS | Status: DC

## 2021-01-16 MED ORDER — SODIUM CHLORIDE 0.9 % IV SOLN
Freq: Once | INTRAVENOUS | Status: AC
  Filled 2021-01-16: qty 250

## 2021-01-16 MED ORDER — IRON SUCROSE 20 MG/ML IV SOLN
200.0000 mg | Freq: Once | INTRAVENOUS | Status: AC
  Administered 2021-01-16: 200 mg via INTRAVENOUS
  Filled 2021-01-16: qty 10

## 2021-01-17 ENCOUNTER — Encounter
Admission: RE | Disposition: A | Discharge status: Home / Self Care | Payer: Self-pay | Source: Home / Self Care | Attending: Gastroenterology

## 2021-01-17 ENCOUNTER — Ambulatory Visit
Admission: RE | Admit: 2021-01-17 | Discharge: 2021-01-17 | Disposition: A | Discharge status: Home / Self Care | Payer: Medicaid Other | Attending: Gastroenterology | Admitting: Gastroenterology

## 2021-01-17 ENCOUNTER — Encounter: Payer: Self-pay | Admitting: Gastroenterology

## 2021-01-17 ENCOUNTER — Ambulatory Visit: Payer: Medicaid Other | Admitting: Anesthesiology

## 2021-01-17 DIAGNOSIS — Z793 Long term (current) use of hormonal contraceptives: Secondary | ICD-10-CM | POA: Insufficient documentation

## 2021-01-17 DIAGNOSIS — K2950 Unspecified chronic gastritis without bleeding: Secondary | ICD-10-CM | POA: Diagnosis not present

## 2021-01-17 DIAGNOSIS — K449 Diaphragmatic hernia without obstruction or gangrene: Secondary | ICD-10-CM | POA: Diagnosis not present

## 2021-01-17 DIAGNOSIS — D509 Iron deficiency anemia, unspecified: Secondary | ICD-10-CM | POA: Diagnosis not present

## 2021-01-17 DIAGNOSIS — Z87891 Personal history of nicotine dependence: Secondary | ICD-10-CM | POA: Diagnosis not present

## 2021-01-17 HISTORY — PX: ESOPHAGOGASTRODUODENOSCOPY (EGD) WITH PROPOFOL: SHX5813

## 2021-01-17 HISTORY — PX: COLONOSCOPY WITH PROPOFOL: SHX5780

## 2021-01-17 LAB — POCT PREGNANCY, URINE: Preg Test, Ur: NEGATIVE

## 2021-01-17 SURGERY — COLONOSCOPY WITH PROPOFOL
Anesthesia: General

## 2021-01-17 MED ORDER — FENTANYL CITRATE (PF) 100 MCG/2ML IJ SOLN
INTRAMUSCULAR | Status: AC
  Filled 2021-01-17: qty 2

## 2021-01-17 MED ORDER — FENTANYL CITRATE (PF) 100 MCG/2ML IJ SOLN
INTRAMUSCULAR | Status: DC | PRN
  Administered 2021-01-17 (×2): 50 ug via INTRAVENOUS

## 2021-01-17 MED ORDER — PROPOFOL 10 MG/ML IV BOLUS
INTRAVENOUS | Status: DC | PRN
  Administered 2021-01-17: 50 mg via INTRAVENOUS

## 2021-01-17 MED ORDER — MIDAZOLAM HCL 2 MG/2ML IJ SOLN
INTRAMUSCULAR | Status: AC
  Filled 2021-01-17: qty 2

## 2021-01-17 MED ORDER — LIDOCAINE HCL (CARDIAC) PF 100 MG/5ML IV SOSY
PREFILLED_SYRINGE | INTRAVENOUS | Status: DC | PRN
  Administered 2021-01-17: 50 mg via INTRAVENOUS

## 2021-01-17 MED ORDER — PROPOFOL 500 MG/50ML IV EMUL
INTRAVENOUS | Status: DC | PRN
  Administered 2021-01-17: 75 ug/kg/min via INTRAVENOUS

## 2021-01-17 MED ORDER — SODIUM CHLORIDE 0.9 % IV SOLN
INTRAVENOUS | Status: DC
  Administered 2021-01-17: 20 mL/h via INTRAVENOUS

## 2021-01-17 MED ORDER — MIDAZOLAM HCL 2 MG/2ML IJ SOLN
INTRAMUSCULAR | Status: DC | PRN
  Administered 2021-01-17 (×2): 2 mg via INTRAVENOUS
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
## 2021-01-17 NOTE — Transfer of Care (Signed)
Immediate Anesthesia Transfer of Care Note  Patient: Candace Morrow  Procedure(s) Performed: COLONOSCOPY WITH PROPOFOL ESOPHAGOGASTRODUODENOSCOPY (EGD) WITH PROPOFOL  Patient Location: PACU  Anesthesia Type:General  Level of Consciousness: sedated  Airway & Oxygen Therapy: Patient Spontanous Breathing and Patient connected to nasal cannula oxygen  Post-op Assessment: Report given to RN and Post -op Vital signs reviewed and stable  Post vital signs: Reviewed and stable  Last Vitals:  Vitals Value Taken Time  BP 136/89 01/17/21 1321  Temp 36.2 C 01/17/21 1321  Pulse 73 01/17/21 1322  Resp 24 01/17/21 1322  SpO2 97 % 01/17/21 1322  Vitals shown include unvalidated device data.  Last Pain:  Vitals:   01/17/21 1321  TempSrc: Temporal  PainSc: Asleep         Complications: No notable events documented.

## 2021-01-17 NOTE — Op Note (Signed)
[facility] Gastroenterology Patient Name: Candace Morrow Procedure Date: 01/17/2021 12:50 PM MRN: [id number] Account #: [id number] Date of Birth: 1980/06/17 Admit Type: Outpatient Age: 40 Room: [facility] ENDO ROOM 4 Gender: Female Note Status: Finalized Instrument Name: Nelda Marseille [id number] Procedure:             Colonoscopy Indications:           Iron deficiency anemia Providers:             Wyline Mood MD, MD Referring MD:          Titus Mould (Referring MD) Medicines:             Monitored Anesthesia Care Complications:         No immediate complications. Procedure:             Pre-Anesthesia Assessment:                        - Prior to the procedure, a History and Physical was                         performed, and patient medications, allergies and                         sensitivities were reviewed. The patient's tolerance                         of previous anesthesia was reviewed.                        - The risks and benefits of the procedure and the                         sedation options and risks were discussed with the                         patient. All questions were answered and informed                         consent was obtained.                        - ASA Grade Assessment: II - A patient with mild                         systemic disease.                        After obtaining informed consent, the colonoscope was                         passed under direct vision. Throughout the procedure,                         the patient's blood pressure, pulse, and oxygen                         saturations were monitored continuously. The                         Colonoscope was introduced through the  anus and                         advanced to the the cecum, identified by the                         appendiceal orifice. The colonoscopy was performed                         with moderate difficulty due to poor endoscopic                          visualization. The patient tolerated the procedure                         well. The quality of the bowel preparation was poor. Findings:      The perianal and digital rectal examinations were normal.      A large amount of semi-liquid stool was found in the entire colon,       interfering with visualization. Impression:            - Preparation of the colon was poor.                        - Stool in the entire examined colon.                        - No specimens collected. Recommendation:        - Discharge patient to home (with escort).                        - Resume previous diet.                        - Continue present medications.                        - Repeat colonoscopy in 6 weeks because the bowel                         preparation was suboptimal. Procedure Code(s):     --- Professional ---                        [phone], Colonoscopy, flexible; diagnostic, including                         collection of specimen(s) by brushing or washing, when                         performed (separate procedure) Diagnosis Code(s):     --- Professional ---                        D50.9, Iron deficiency anemia, unspecified CPT copyright 2019 American Medical Association. All rights reserved. The codes documented in this report are preliminary and upon coder review may  be revised to meet current compliance requirements. Wyline Mood, MD Wyline Mood MD, MD 01/17/2021 1:17:27 PM This report has been signed electronically. Number of Addenda: 0 Note Initiated On: 01/17/2021 12:50 PM Scope Withdrawal Time: 0 hours 7 minutes 14  seconds  Total Procedure Duration: 0 hours 10 minutes 52 seconds  Estimated Blood Loss:  Estimated blood loss: none.      [facility]

## 2021-01-17 NOTE — Anesthesia Postprocedure Evaluation (Signed)
Anesthesia Post Note  Patient: Candace Morrow  Procedure(s) Performed: COLONOSCOPY WITH PROPOFOL ESOPHAGOGASTRODUODENOSCOPY (EGD) WITH PROPOFOL  Patient location during evaluation: Endoscopy Anesthesia Type: General Level of consciousness: awake and alert Pain management: pain level controlled Vital Signs Assessment: post-procedure vital signs reviewed and stable Respiratory status: spontaneous breathing, nonlabored ventilation and respiratory function stable Cardiovascular status: blood pressure returned to baseline and stable Postop Assessment: no apparent nausea or vomiting Anesthetic complications: no   No notable events documented.   Last Vitals:  Vitals:   01/17/21 1341 01/17/21 1351  BP: 140/84 134/87  Pulse: 64 66  Resp: 17 19  Temp:    SpO2: 98% 99%    Last Pain:  Vitals:   01/17/21 1351  TempSrc:   PainSc: 0-No pain                 Foye Deer

## 2021-01-17 NOTE — Op Note (Signed)
[facility] Gastroenterology Patient Name: Candace Morrow Procedure Date: 01/17/2021 12:51 PM MRN: [id number] Account #: [id number] Date of Birth: 05/31/1980 Admit Type: Outpatient Age: 40 Room: [facility] ENDO ROOM 4 Gender: Female Note Status: Finalized Instrument Name: Upper Endoscope [id number] Procedure:             Upper GI endoscopy Indications:           Iron deficiency anemia Providers:             Wyline Mood MD, MD Referring MD:          Courtney Paris. Cliffton Asters, MD (Referring MD) Medicines:             Monitored Anesthesia Care Complications:         No immediate complications. Procedure:             Pre-Anesthesia Assessment:                        - Prior to the procedure, a History and Physical was                         performed, and patient medications, allergies and                         sensitivities were reviewed. The patient's tolerance                         of previous anesthesia was reviewed.                        - The risks and benefits of the procedure and the                         sedation options and risks were discussed with the                         patient. All questions were answered and informed                         consent was obtained.                        - ASA Grade Assessment: II - A patient with mild                         systemic disease.                        After obtaining informed consent, the endoscope was                         passed under direct vision. Throughout the procedure,                         the patient's blood pressure, pulse, and oxygen                         saturations were monitored continuously. The                         Endosonoscope  was introduced through the mouth, and                         advanced to the third part of duodenum. The upper GI                         endoscopy was accomplished with ease. The patient                         tolerated the procedure. Findings:      The  esophagus was normal.      The examined duodenum was normal.      The entire examined stomach was normal. Biopsies were taken with a cold       forceps for histology.      The cardia and gastric fundus were normal on retroflexion. Impression:            - Normal esophagus.                        - Normal examined duodenum.                        - Normal stomach. Biopsied. Recommendation:        - Await pathology results.                        - Perform a colonoscopy. Procedure Code(s):     --- Professional ---                        [phone], Esophagogastroduodenoscopy, flexible,                         transoral; with biopsy, single or multiple Diagnosis Code(s):     --- Professional ---                        D50.9, Iron deficiency anemia, unspecified CPT copyright 2019 American Medical Association. All rights reserved. The codes documented in this report are preliminary and upon coder review may  be revised to meet current compliance requirements. Wyline Mood, MD Wyline Mood MD, MD 01/17/2021 1:03:20 PM This report has been signed electronically. Number of Addenda: 0 Note Initiated On: 01/17/2021 12:51 PM Estimated Blood Loss:  Estimated blood loss: none.      [facility]

## 2021-01-17 NOTE — Anesthesia Preprocedure Evaluation (Addendum)
Anesthesia Evaluation  Patient identified by MRN, date of birth, ID band Patient awake  General Assessment Comment:Some increased work of breathing after last colonoscopy  Reviewed: Allergy & Precautions, NPO status , Patient's Chart, lab work & pertinent test results, reviewed documented beta blocker date and time   History of Anesthesia Complications (+) history of anesthetic complications  Airway Mallampati: II  TM Distance: >3 FB     Dental no notable dental hx.    Pulmonary asthma , Current Smoker and Patient abstained from smoking.,    Pulmonary exam normal        Cardiovascular hypertension, Pt. on medications and Pt. on home beta blockers Normal cardiovascular exam     Neuro/Psych  Headaches, negative psych ROS   GI/Hepatic Neg liver ROS, GERD  Medicated,  Endo/Other  Morbid obesity  Renal/GU negative Renal ROS  negative genitourinary   Musculoskeletal negative musculoskeletal ROS (+) Arthritis , Osteoarthritis,  Lower back pain   Abdominal Normal abdominal exam  (+) - obese,   Peds negative pediatric ROS (+)  Hematology negative hematology ROS (+) Blood dyscrasia (IDA), anemia ,   Anesthesia Other Findings   Reproductive/Obstetrics                            Anesthesia Physical  Anesthesia Plan  ASA: III  Anesthesia Plan: General   Post-op Pain Management:    Induction: Intravenous  PONV Risk Score and Plan:   Airway Management Planned: Natural Airway and Nasal Cannula  Additional Equipment:   Intra-op Plan:   Post-operative Plan:   Informed Consent: I have reviewed the patients History and Physical, chart, labs and discussed the procedure including the risks, benefits and alternatives for the proposed anesthesia with the patient or authorized representative who has indicated his/her understanding and acceptance.     Dental advisory given  Plan Discussed with:  CRNA and Anesthesiologist  Anesthesia Plan Comments:        Anesthesia Quick Evaluation

## 2021-01-20 LAB — SURGICAL PATHOLOGY

## 2021-01-26 ENCOUNTER — Encounter: Payer: Self-pay | Admitting: Gastroenterology

## 2021-02-04 ENCOUNTER — Ambulatory Visit
Admission: RE | Admit: 2021-02-04 | Discharge: 2021-02-04 | Disposition: A | Discharge status: Home / Self Care | Payer: Medicaid Other | Attending: Family Medicine | Admitting: Family Medicine

## 2021-02-04 ENCOUNTER — Other Ambulatory Visit: Payer: Self-pay

## 2021-02-04 ENCOUNTER — Other Ambulatory Visit: Payer: Self-pay | Admitting: Family Medicine

## 2021-02-04 ENCOUNTER — Ambulatory Visit
Admission: RE | Admit: 2021-02-04 | Discharge: 2021-02-04 | Disposition: A | Discharge status: Home / Self Care | Payer: Medicaid Other | Source: Ambulatory Visit | Attending: Family Medicine | Admitting: Family Medicine

## 2021-02-04 DIAGNOSIS — R200 Anesthesia of skin: Secondary | ICD-10-CM | POA: Insufficient documentation

## 2021-02-28 ENCOUNTER — Inpatient Hospital Stay: Payer: Medicaid Other | Attending: Oncology

## 2021-02-28 ENCOUNTER — Inpatient Hospital Stay: Payer: Medicaid Other

## 2021-02-28 ENCOUNTER — Other Ambulatory Visit: Payer: Medicaid Other

## 2021-02-28 NOTE — Telephone Encounter (Signed)
Left vm on 10/11 as well as sent MyChart message.  Received notification from MyChart that the message was never read.  Canceled 11/4 lab appointment as [place] [facility] is closed.

## 2021-03-03 ENCOUNTER — Inpatient Hospital Stay: Payer: Medicaid Other | Admitting: Oncology

## 2021-03-04 ENCOUNTER — Inpatient Hospital Stay: Payer: Medicaid Other | Attending: Oncology

## 2021-03-04 ENCOUNTER — Ambulatory Visit: Payer: Medicaid Other

## 2021-03-04 ENCOUNTER — Other Ambulatory Visit: Payer: Medicaid Other

## 2021-03-04 ENCOUNTER — Other Ambulatory Visit: Payer: Self-pay

## 2021-03-04 VITALS — BP 137/82 | HR 84 | Resp 18

## 2021-03-04 DIAGNOSIS — E538 Deficiency of other specified B group vitamins: Secondary | ICD-10-CM | POA: Diagnosis not present

## 2021-03-04 DIAGNOSIS — Z79899 Other long term (current) drug therapy: Secondary | ICD-10-CM | POA: Insufficient documentation

## 2021-03-04 DIAGNOSIS — D509 Iron deficiency anemia, unspecified: Secondary | ICD-10-CM

## 2021-03-04 LAB — CBC WITH DIFFERENTIAL/PLATELET
Abs Immature Granulocytes: 0.04 10*3/uL (ref 0.00–0.07)
Basophils Absolute: 0.1 10*3/uL (ref 0.0–0.1)
Basophils Relative: 1 %
Eosinophils Absolute: 0.2 10*3/uL (ref 0.0–0.5)
Eosinophils Relative: 2 %
HCT: 41.8 % (ref 36.0–46.0)
Hemoglobin: 13.1 g/dL (ref 12.0–15.0)
Immature Granulocytes: 0 %
Lymphocytes Relative: 22 %
Lymphs Abs: 2.3 10*3/uL (ref 0.7–4.0)
MCH: 26.4 pg (ref 26.0–34.0)
MCHC: 31.3 g/dL (ref 30.0–36.0)
MCV: 84.3 fL (ref 80.0–100.0)
Monocytes Absolute: 0.8 10*3/uL (ref 0.1–1.0)
Monocytes Relative: 8 %
Neutro Abs: 6.9 10*3/uL (ref 1.7–7.7)
Neutrophils Relative %: 67 %
Platelets: 246 10*3/uL (ref 150–400)
RBC: 4.96 MIL/uL (ref 3.87–5.11)
RDW: 18.1 % — ABNORMAL HIGH (ref 11.5–15.5)
WBC: 10.3 10*3/uL (ref 4.0–10.5)
nRBC: 0 % (ref 0.0–0.2)

## 2021-03-04 LAB — IRON AND TIBC
Iron: 42 ug/dL (ref 28–170)
Saturation Ratios: 12 % (ref 10.4–31.8)
TIBC: 357 ug/dL (ref 250–450)
UIBC: 315 ug/dL

## 2021-03-04 LAB — FERRITIN: Ferritin: 129 ng/mL (ref 11–307)

## 2021-03-04 MED ORDER — CYANOCOBALAMIN 1000 MCG/ML IJ SOLN
1000.0000 ug | INTRAMUSCULAR | Status: DC
  Administered 2021-03-04: 1000 ug via INTRAMUSCULAR
  Filled 2021-03-04: qty 1

## 2021-03-06 ENCOUNTER — Encounter: Payer: Self-pay | Admitting: Nurse Practitioner

## 2021-03-06 ENCOUNTER — Inpatient Hospital Stay (HOSPITAL_BASED_OUTPATIENT_CLINIC_OR_DEPARTMENT_OTHER): Payer: Medicaid Other | Admitting: Nurse Practitioner

## 2021-03-06 ENCOUNTER — Telehealth: Payer: Self-pay | Admitting: *Deleted

## 2021-03-06 DIAGNOSIS — E538 Deficiency of other specified B group vitamins: Secondary | ICD-10-CM

## 2021-03-06 DIAGNOSIS — D509 Iron deficiency anemia, unspecified: Secondary | ICD-10-CM

## 2021-03-06 NOTE — Telephone Encounter (Signed)
Returned call to patient per phone request. Pt states her visit was changed to a my chart visit. Her My chart is not functioning correctly. I spoke to Her provider today, Consuello Masse NP who will send her a text message at her appt time.
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
## 2021-03-06 NOTE — Patient Instructions (Signed)
Dr. Tobi Bastos  [phone]

## 2021-03-07 ENCOUNTER — Other Ambulatory Visit: Payer: Self-pay | Admitting: *Deleted

## 2021-03-07 DIAGNOSIS — E538 Deficiency of other specified B group vitamins: Secondary | ICD-10-CM

## 2021-03-07 DIAGNOSIS — D509 Iron deficiency anemia, unspecified: Secondary | ICD-10-CM

## 2021-03-15 ENCOUNTER — Encounter: Payer: Self-pay | Admitting: Oncology

## 2021-04-03 ENCOUNTER — Inpatient Hospital Stay: Payer: Medicaid Other | Attending: Oncology

## 2021-05-08 ENCOUNTER — Inpatient Hospital Stay: Payer: Medicaid Other | Attending: Oncology

## 2021-06-02 ENCOUNTER — Other Ambulatory Visit: Payer: Self-pay | Admitting: *Deleted

## 2021-06-02 DIAGNOSIS — D509 Iron deficiency anemia, unspecified: Secondary | ICD-10-CM

## 2021-06-09 ENCOUNTER — Inpatient Hospital Stay: Payer: Medicaid Other

## 2021-06-09 ENCOUNTER — Inpatient Hospital Stay: Payer: Medicaid Other | Attending: Oncology

## 2021-06-09 ENCOUNTER — Other Ambulatory Visit: Payer: Self-pay

## 2021-06-09 DIAGNOSIS — E538 Deficiency of other specified B group vitamins: Secondary | ICD-10-CM | POA: Insufficient documentation

## 2021-06-09 DIAGNOSIS — D509 Iron deficiency anemia, unspecified: Secondary | ICD-10-CM | POA: Insufficient documentation

## 2021-06-09 DIAGNOSIS — Z79899 Other long term (current) drug therapy: Secondary | ICD-10-CM | POA: Insufficient documentation

## 2021-06-09 LAB — CBC
HCT: 41.1 % (ref 36.0–46.0)
Hemoglobin: 13.2 g/dL (ref 12.0–15.0)
MCH: 28 pg (ref 26.0–34.0)
MCHC: 32.1 g/dL (ref 30.0–36.0)
MCV: 87.3 fL (ref 80.0–100.0)
Platelets: 252 10*3/uL (ref 150–400)
RBC: 4.71 MIL/uL (ref 3.87–5.11)
RDW: 14.7 % (ref 11.5–15.5)
WBC: 12.7 10*3/uL — ABNORMAL HIGH (ref 4.0–10.5)
nRBC: 0 % (ref 0.0–0.2)

## 2021-06-09 LAB — IRON AND TIBC
Iron: 39 ug/dL (ref 28–170)
Saturation Ratios: 11 % (ref 10.4–31.8)
TIBC: 349 ug/dL (ref 250–450)
UIBC: 310 ug/dL

## 2021-06-09 LAB — FERRITIN: Ferritin: 98 ng/mL (ref 11–307)

## 2021-06-09 MED ORDER — CYANOCOBALAMIN 1000 MCG/ML IJ SOLN
1000.0000 ug | INTRAMUSCULAR | Status: DC
  Administered 2021-06-09: 1000 ug via INTRAMUSCULAR
  Filled 2021-06-09: qty 1

## 2021-06-11 ENCOUNTER — Encounter: Payer: Self-pay | Admitting: Oncology

## 2021-06-11 ENCOUNTER — Inpatient Hospital Stay (HOSPITAL_BASED_OUTPATIENT_CLINIC_OR_DEPARTMENT_OTHER): Payer: Medicaid Other | Admitting: Oncology

## 2021-06-11 ENCOUNTER — Other Ambulatory Visit: Payer: Self-pay

## 2021-06-11 DIAGNOSIS — D509 Iron deficiency anemia, unspecified: Secondary | ICD-10-CM | POA: Diagnosis not present

## 2021-06-22 ENCOUNTER — Encounter: Payer: Self-pay | Admitting: Oncology
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
## 2021-07-09 ENCOUNTER — Encounter: Payer: Self-pay | Admitting: Oncology

## 2021-07-09 ENCOUNTER — Inpatient Hospital Stay: Payer: Medicaid Other | Attending: Oncology

## 2021-07-09 ENCOUNTER — Telehealth: Payer: Self-pay | Admitting: Oncology

## 2021-07-09 ENCOUNTER — Other Ambulatory Visit: Payer: Self-pay

## 2021-07-09 DIAGNOSIS — D509 Iron deficiency anemia, unspecified: Secondary | ICD-10-CM | POA: Diagnosis present

## 2021-07-09 DIAGNOSIS — E538 Deficiency of other specified B group vitamins: Secondary | ICD-10-CM | POA: Diagnosis not present

## 2021-07-09 DIAGNOSIS — Z79899 Other long term (current) drug therapy: Secondary | ICD-10-CM | POA: Insufficient documentation

## 2021-07-09 MED ORDER — CYANOCOBALAMIN 1000 MCG/ML IJ SOLN: 1000.0000 ug | INTRAMUSCULAR | 11 refills | Status: AC

## 2021-07-09 MED ORDER — CYANOCOBALAMIN 1000 MCG/ML IJ SOLN
1000.0000 ug | INTRAMUSCULAR | Status: DC
  Administered 2021-07-09: 1000 ug via INTRAMUSCULAR
  Filled 2021-07-09: qty 1

## 2021-07-09 NOTE — Telephone Encounter (Signed)
Patient wanted to me to send a message over to DR. Rao team telling them she has kept her insurance - [employer] [facility] in case they wanted her to continue treatment, etc ?

## 2021-07-12 ENCOUNTER — Encounter: Payer: Self-pay | Admitting: Oncology

## 2021-08-11 ENCOUNTER — Inpatient Hospital Stay: Payer: Medicaid Other

## 2021-09-08 ENCOUNTER — Inpatient Hospital Stay: Payer: Medicaid Other

## 2021-09-30 ENCOUNTER — Encounter: Payer: Self-pay | Admitting: Oncology

## 2021-10-10 ENCOUNTER — Inpatient Hospital Stay: Payer: Medicaid Other | Attending: Oncology

## 2021-10-10 ENCOUNTER — Inpatient Hospital Stay: Payer: Medicaid Other

## 2021-11-12 ENCOUNTER — Inpatient Hospital Stay: Payer: Medicaid Other

## 2021-12-10 ENCOUNTER — Inpatient Hospital Stay: Payer: Medicaid Other

## 2022-01-09 ENCOUNTER — Inpatient Hospital Stay: Payer: Medicaid Other

## 2022-02-09 ENCOUNTER — Inpatient Hospital Stay: Payer: Medicaid Other

## 2022-02-09 ENCOUNTER — Ambulatory Visit: Payer: Medicaid Other

## 2022-02-09 ENCOUNTER — Inpatient Hospital Stay: Payer: Medicaid Other | Admitting: Oncology

## 2022-02-10 ENCOUNTER — Telehealth: Payer: Self-pay | Admitting: *Deleted

## 2022-02-10 NOTE — Telephone Encounter (Signed)
Pt wanted to know if she an get labs in [place] due to she had surgery and wanted to do video visit. The pt has had video visits 2 times back to back. Md prefers to see in person. We can move the appt out a month. Candace Morrow spoke to the pt and she r/s for November and she will get labs here and then see md on same day

## 2022-03-10 ENCOUNTER — Inpatient Hospital Stay: Payer: Medicaid Other

## 2022-03-10 ENCOUNTER — Inpatient Hospital Stay: Payer: Medicaid Other | Admitting: Oncology

## 2023-04-29 ENCOUNTER — Encounter: Payer: Self-pay | Admitting: Oncology

## 2023-04-29 NOTE — Telephone Encounter (Signed)
 Error

## 2023-07-17 IMAGING — CR DG LUMBAR SPINE 2-3V
5 series · 5 of 5 positions shown · non-contrast
Comparison: None.

CLINICAL DATA: Numbness right thigh.

EXAM:
LUMBAR SPINE - COMPLETE 4+ VIEW

[l-spine ap]
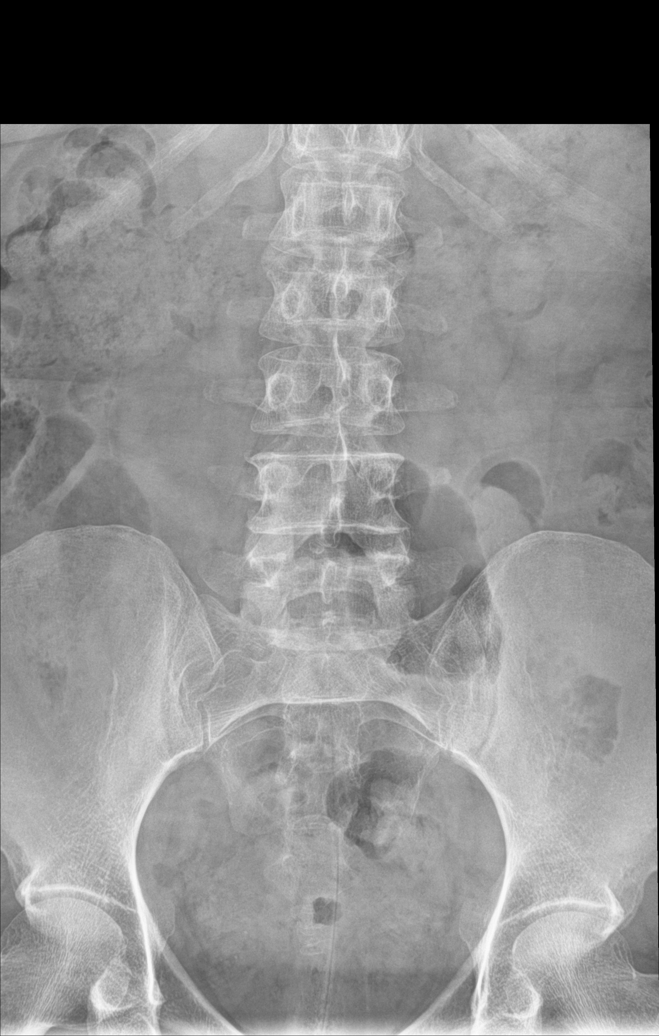

[l-spine spot]
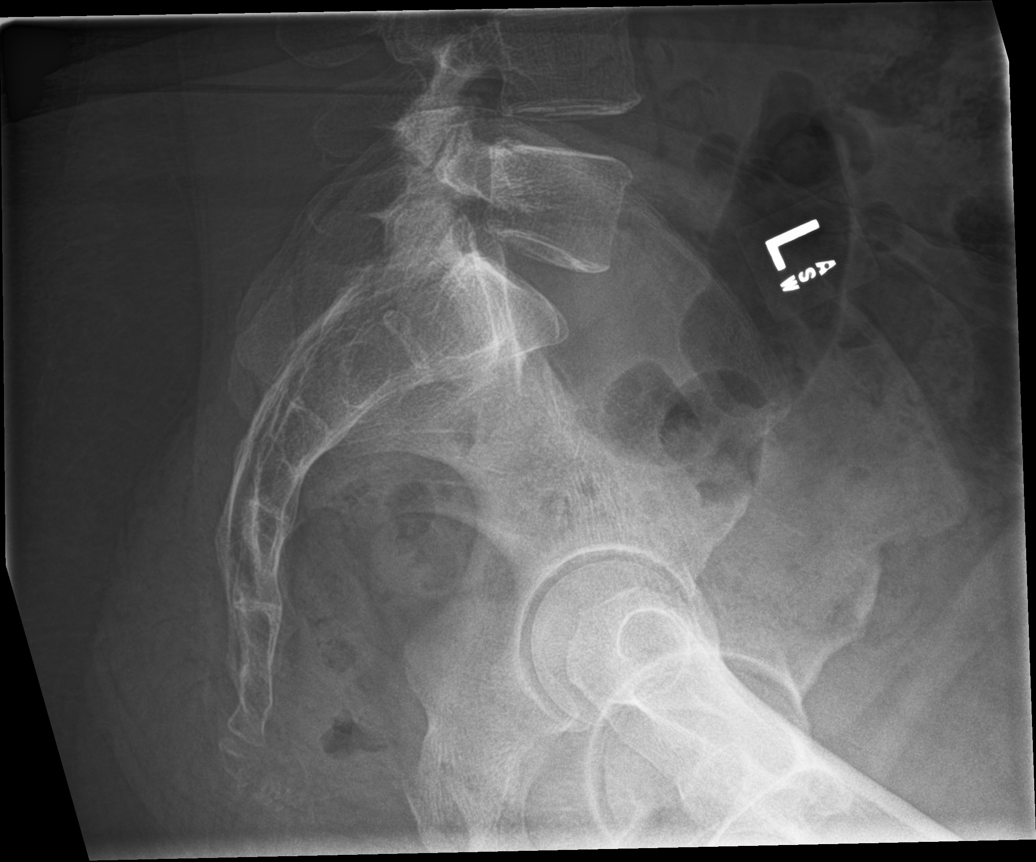

[l-spine obl (1 of 2)]
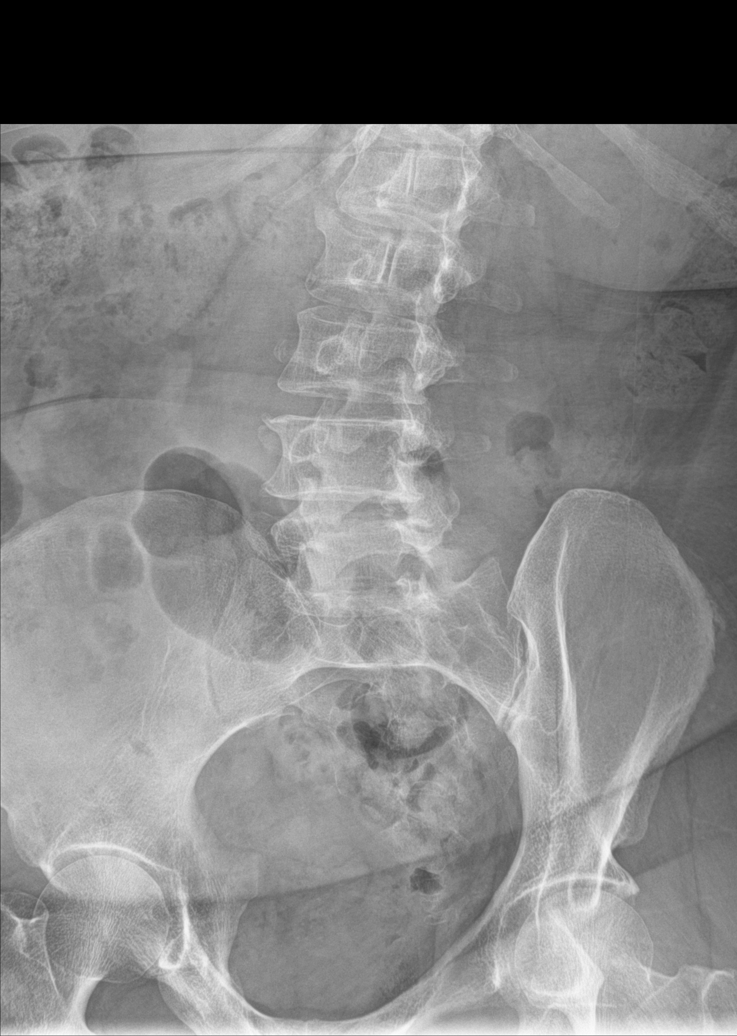

[l-spine obl (2 of 2)]
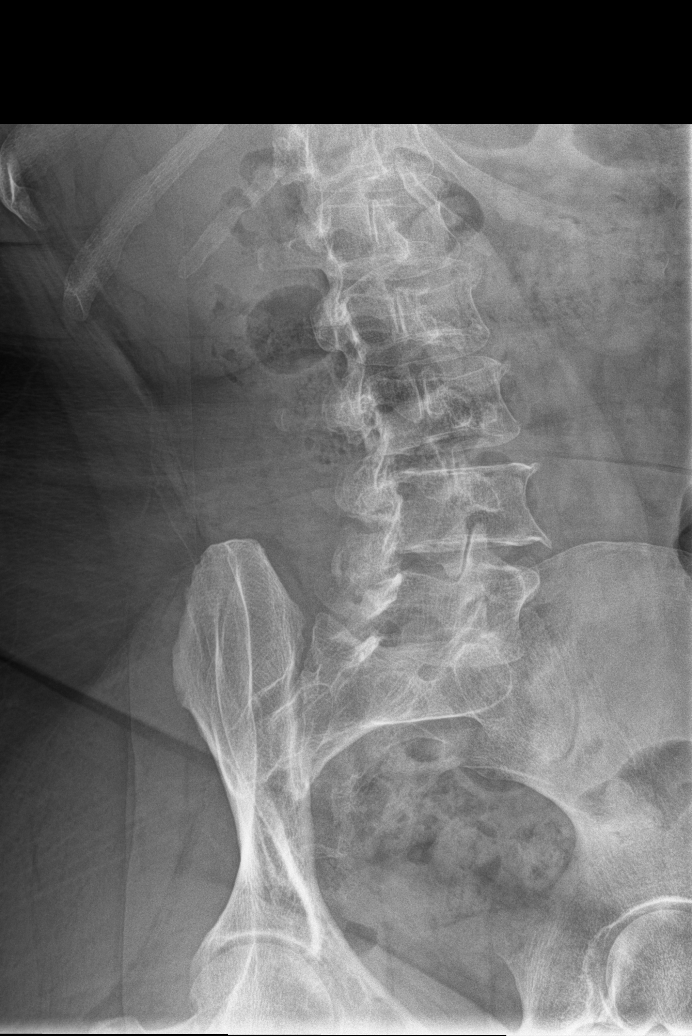

[l-spine lat]
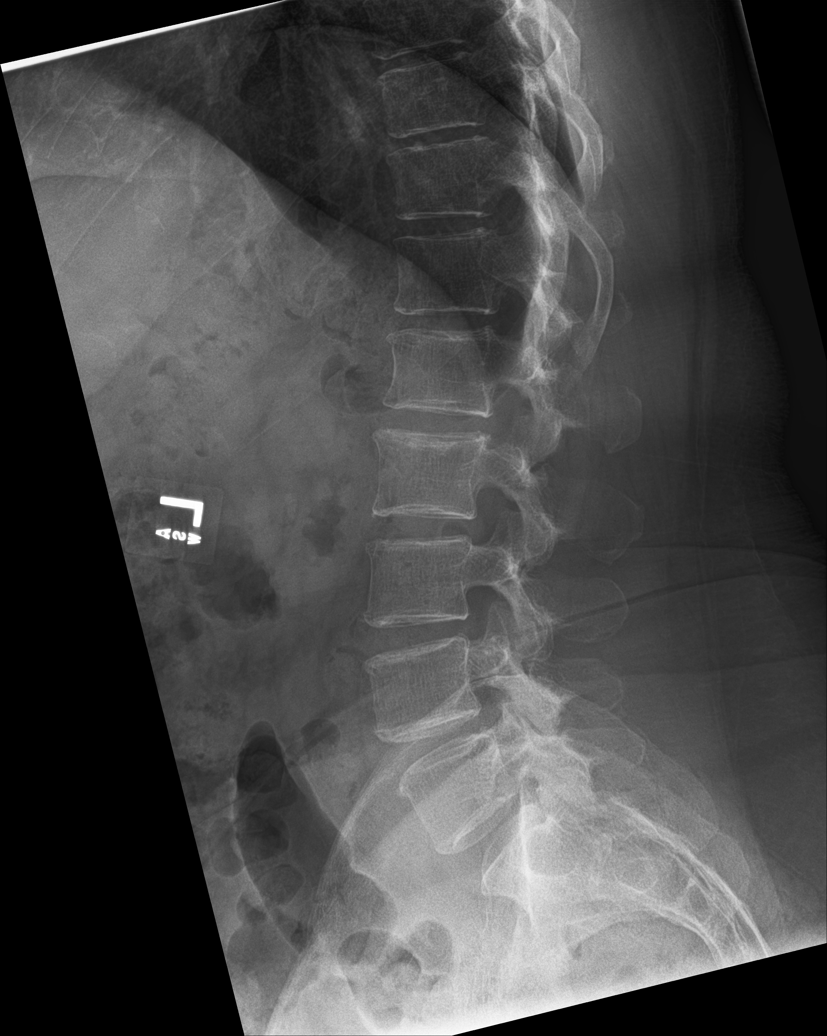

[5 of 5 positions shown; findings below may reference images not displayed]

FINDINGS: There are 5 lumbar type vertebra. Normal alignment. Vertebral body
heights are normal. Trace endplate spurring at multiple levels with
preservation of disc spaces. Mild L4-L5 facet hypertrophy. There is
no evidence of fracture, pars defects, or focal bone abnormality.
The sacroiliac joints are congruent.
IMPRESSION: 1. Mild L4-L5 facet hypertrophy.
2. Minimal multilevel endplate spurring without disc space
narrowing.
